# Patient Record
Sex: Male | Born: 1965 | Race: White | Hispanic: No | Smoking: Former smoker
Health system: Southern US, Community
[De-identification: ages and names within clinical notes are randomized; demographics above are authoritative.]

## PROBLEM LIST (undated history)

## (undated) DIAGNOSIS — G8929 Other chronic pain: Secondary | ICD-10-CM

## (undated) DIAGNOSIS — M549 Dorsalgia, unspecified: Secondary | ICD-10-CM

## (undated) DIAGNOSIS — G4730 Sleep apnea, unspecified: Secondary | ICD-10-CM

## (undated) DIAGNOSIS — E669 Obesity, unspecified: Secondary | ICD-10-CM

## (undated) DIAGNOSIS — Z789 Other specified health status: Secondary | ICD-10-CM

## (undated) HISTORY — PX: TONSILLECTOMY: SUR1361

## (undated) HISTORY — PX: OTHER SURGICAL HISTORY: SHX169

## (undated) HISTORY — PX: ADENOIDECTOMY: SUR15

---

## 2001-12-07 ENCOUNTER — Emergency Department (HOSPITAL_COMMUNITY): Admission: EM | Admit: 2001-12-07 | Discharge: 2001-12-07 | Payer: Self-pay | Admitting: Emergency Medicine

## 2001-12-07 ENCOUNTER — Encounter: Payer: Self-pay | Admitting: Emergency Medicine

## 2001-12-22 ENCOUNTER — Emergency Department (HOSPITAL_COMMUNITY): Admission: EM | Admit: 2001-12-22 | Discharge: 2001-12-22 | Payer: Self-pay

## 2010-05-31 ENCOUNTER — Encounter: Payer: Self-pay | Admitting: Pulmonary Disease

## 2010-06-06 ENCOUNTER — Encounter: Payer: Self-pay | Admitting: Pulmonary Disease

## 2010-07-26 DIAGNOSIS — E781 Pure hyperglyceridemia: Secondary | ICD-10-CM

## 2010-07-27 ENCOUNTER — Ambulatory Visit: Payer: Self-pay | Admitting: Pulmonary Disease

## 2010-07-27 DIAGNOSIS — G4710 Hypersomnia, unspecified: Secondary | ICD-10-CM | POA: Insufficient documentation

## 2010-07-27 DIAGNOSIS — G4730 Sleep apnea, unspecified: Secondary | ICD-10-CM

## 2010-08-04 ENCOUNTER — Ambulatory Visit: Payer: Self-pay | Admitting: Internal Medicine

## 2010-08-04 DIAGNOSIS — L259 Unspecified contact dermatitis, unspecified cause: Secondary | ICD-10-CM | POA: Insufficient documentation

## 2010-08-05 DIAGNOSIS — K219 Gastro-esophageal reflux disease without esophagitis: Secondary | ICD-10-CM | POA: Insufficient documentation

## 2010-08-21 ENCOUNTER — Encounter: Payer: Self-pay | Admitting: Internal Medicine

## 2010-08-21 ENCOUNTER — Encounter: Payer: Self-pay | Admitting: Pulmonary Disease

## 2010-08-21 ENCOUNTER — Ambulatory Visit (HOSPITAL_BASED_OUTPATIENT_CLINIC_OR_DEPARTMENT_OTHER)
Admission: RE | Admit: 2010-08-21 | Discharge: 2010-08-21 | Payer: Self-pay | Source: Home / Self Care | Admitting: Pulmonary Disease

## 2010-08-25 ENCOUNTER — Telehealth: Payer: Self-pay | Admitting: Pulmonary Disease

## 2010-08-28 ENCOUNTER — Encounter: Payer: Self-pay | Admitting: Pulmonary Disease

## 2010-09-02 ENCOUNTER — Ambulatory Visit: Payer: Self-pay | Admitting: Cardiology

## 2010-09-02 ENCOUNTER — Encounter: Payer: Self-pay | Admitting: Cardiology

## 2010-09-02 DIAGNOSIS — E669 Obesity, unspecified: Secondary | ICD-10-CM

## 2010-09-02 DIAGNOSIS — F17200 Nicotine dependence, unspecified, uncomplicated: Secondary | ICD-10-CM

## 2010-09-09 ENCOUNTER — Telehealth (INDEPENDENT_AMBULATORY_CARE_PROVIDER_SITE_OTHER): Payer: Self-pay | Admitting: *Deleted

## 2010-09-12 ENCOUNTER — Ambulatory Visit: Payer: Self-pay | Admitting: Pulmonary Disease

## 2010-09-15 ENCOUNTER — Encounter: Payer: Self-pay | Admitting: Pulmonary Disease

## 2010-10-05 ENCOUNTER — Ambulatory Visit: Admit: 2010-10-05 | Payer: Self-pay | Admitting: Internal Medicine

## 2010-10-20 ENCOUNTER — Telehealth: Payer: Self-pay | Admitting: Pulmonary Disease

## 2010-10-24 ENCOUNTER — Ambulatory Visit: Admit: 2010-10-24 | Payer: Self-pay | Admitting: Cardiology

## 2010-10-25 NOTE — Progress Notes (Signed)
  Phone Note Outgoing Call   Call placed by: Comer Locket. Vassie Loll MD,  August 25, 2010 1:24 PM Action Taken: Phone Call Completed Reason for Call: Discuss lab or test results Summary of Call: discused PSG results - start CPAP 11 cm large full face mask, humidity, download in 4 weeks  Initial call taken by: Comer Locket. Vassie Loll MD,  August 25, 2010 1:25 PM

## 2010-10-25 NOTE — Assessment & Plan Note (Signed)
Summary: sleep apnea/apc   Visit Type:  Initial Consult  CC:  Sleep consult.  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 He has gained 20-30 lbs over the last 2 years. Bed partner has noted loud snoring  - enough to sleep in a different room& witnesed apneas. He reportsd restless sleep & tossing & turning, no fixed bedtime or wake up times unless he has to be at court, Works shifts, non refreshing sleep, unplanned  naps on days off. sleeps on side , x 2 pillows Iced tea , coffee, sodas  3-4 . Epworth Sleepiness Score 20  There is no history suggestive of cataplexy, sleep paralysis or parasomnias  He also does not have a PCP & would like [place] to get im one.  Preventive Screening-Counseling & Management  Alcohol-Tobacco     Alcohol drinks/day: <1     Alcohol type: all     Smoking Status: current     Packs/Day: 1.5     Year Started: 1995   History of Present Illness: not sleeping restful, waking up during the night. Falling aslep when sitting up. Give out all the time  What time do you typically go to bed?(between what hours): unable to answer  How long does it take you to fall asleep? depends on sitting up or laying down  How many times during the night do you wake up? at least 3 to 4 times I remember  What time do you get out of bed to start your day? unable to answer  Do you drive or operate heavy machinery in your occupation? yes  How much has your weight changed (up or down) over the past two years? (in pounds): drop 20 and regain 20  Have you ever had a sleep study before?  If yes,when and where: no  Do you currently use CPAP ? If so , at what pressure? no  Do you wear oxygen at any time? If yes, how many liters per minute? no Current Medications (verified): 1)  Zantac 150 Mg Tabs (Ranitidine Hcl) .... As Needed 2)  Nexium 40 Mg Cpdr (Esomeprazole  Magnesium) .... As Needed Usually Once Weekly 3)  Bc Fast Pain Relief 650-195-33.3 Mg Pack (Aspirin-Salicylamide-Caffeine) .... As Needed  Allergies (verified): 1)  ! Pcn  Past History:  Past Surgical History: Last updated: 08-15-10 Tonsillectomy  Family History: Last updated: 08-15-2010 Heart dz- Father (deceased age 12 with MI)  Social History: Current smoker.  Smokes 1 ppd. Marital Status: single Children: yes Occupation: Chartered loss adjuster Smoking Status:  current Packs/Day:  1.5 Alcohol drinks/day:  <1  Review of Systems       The patient complains of shortness of breath with activity, productive cough, non-productive cough, irregular heartbeats, acid heartburn, indigestion, weight change, tooth/dental problems, headaches, sneezing, itching, anxiety, hand/feet swelling, joint stiffness or pain, rash, and change in color of mucus.  The patient denies shortness of breath at rest, coughing up blood, chest pain, loss of appetite, abdominal pain, difficulty swallowing, sore throat, nasal congestion/difficulty breathing through nose, ear ache, depression, and fever.    Vital Signs:  Patient profile:   45 year old male Height:      73 inches Weight:      313.2 pounds BMI:     41.47 O2 Sat:      97 % on Room air Temp:     97.7 degrees F  oral Pulse rate:   98 / minute BP sitting:   134 / 90  (left arm) Cuff size:   large  Vitals Entered By: Zackery Barefoot CMA (July 27, 2010 2:20 PM)  O2 Flow:  Room air CC: Sleep consult Comments Medications reviewed with patient Verified contact number and pharmacy with patient Zackery Barefoot CMA  July 27, 2010 2:22 PM    Physical Exam  Additional Exam:  wt 313 July 27, 2010  Gen. Pleasant, obese, in no distress, normal affect ENT - no lesions, no post nasal drip, class 3 airway Neck: No JVD, no thyromegaly, no carotid bruits Lungs: no use of accessory muscles, no dullness to percussion, clear without rales or rhonchi    Cardiovascular: Rhythm regular, heart sounds  normal, no murmurs or gallops, no peripheral edema Abdomen: soft and non-tender, no hepatosplenomegaly, BS normal. Musculoskeletal: No deformities, no cyanosis or clubbing Neuro:  alert, non focal     Impression & Recommendations:  Problem # 1:  HYPERSOMNIA, ASSOCIATED WITH SLEEP APNEA (ICD-780.53) Pre test prob is very high The pathophysiology of obstructive sleep apnea, it's cardiovascular consequences and modes of treatment including CPAP were discussed with the patient in great detail.  We will get him with one of our IM doctors  Orders: Consultation Level III ([address]109) Internal Medicine Referral (Internal) Sleep Disorder Referral (Sleep Disorder)  Medications Added to Medication List This Visit: 1)  Zantac 150 Mg Tabs (Ranitidine hcl) .... As needed 2)  Nexium 40 Mg Cpdr (Esomeprazole magnesium) .... As needed usually once weekly 3)  Bc Fast Pain Relief 650-195-33.3 Mg Pack (Aspirin-salicylamide-caffeine) .... As needed  Patient Instructions: 1)  Copy sent to: Dr Ronal Fear  fax [phone] 2)  Please schedule a follow-up appointment in 2 weeks after sleep study 3)  Internal Medicine consultation   Not Administered:    Influenza Vaccine not given due to: declined

## 2010-10-25 NOTE — Assessment & Plan Note (Signed)
Summary: NEW/ BCBS /[place]   Vital Signs:  Patient profile:   45 year old male Height:      73 inches Weight:      313 pounds BMI:     41.44 O2 Sat:      95 % on Room air Temp:     98.4 degrees F oral Pulse rate:   85 / minute Pulse rhythm:   regular Resp:     16 per minute BP sitting:   132 / 82  (left arm) Cuff size:   large  Vitals Entered By: Rock Nephew CMA (August 04, 2010 1:57 PM)  Nutrition Counseling: Patient's BMI is greater than 25 and therefore counseled on weight management options.  O2 Flow:  Room air CC: Patient here to establish a PCP, Rash Is Patient Diabetic? No Pain Assessment Patient in pain? no       Does patient need assistance? Functional Status Self care Ambulation Normal   Primary Care Provider:  Etta Grandchild MD  CC:  Patient here to establish a PCP and Rash.  History of Present Illness:  Rash      This is a 45 year old man who presents with Rash.  The symptoms began >1 year ago.  The severity is described as moderate.  The patient reports itching and scaling, but denies macules, papules, nodules, hives, welts, pustules, blisters, ulcers, weeping, oozing, redness, increased warmth, and tenderness.  The rash is located on the chest, back, abdomen, right arm, left arm, right leg, left leg, and face/trunk/limbs diffusely.  The rash is worse with heat and worse with scratching.  The patient denies the following symptoms: fever, headache, facial swelling, tongue swelling, burning, difficulty breathing, abdominal pain, nausea, vomiting, diarrhea, dizziness, sore throat, dysuria, eye symptoms, and arthralgias.  The patient denies history of recent tick bite, recent tick exposure, other insect bite, recent infection, recent antibiotic use, new medication, new clothing, new topical exposure, recent travel, pet/animal contact, thyroid disease, chronic liver disease, autoimmune disease, chronic edema, and prior STD.    Preventive Screening-Counseling &  Management  Alcohol-Tobacco     Alcohol drinks/day: <1     Alcohol type: beer     >5/day in last 3 mos: no     Alcohol Counseling: not indicated; use of alcohol is not excessive or problematic     Feels need to cut down: no     Feels annoyed by complaints: no     Feels guilty re: drinking: no     Needs 'eye opener' in am: no     Smoking Status: never     Tobacco Counseling: not indicated; no tobacco use  Current Medications (verified): 1)  Zantac 150 Mg Tabs (Ranitidine Hcl) .... As Needed 2)  Nexium 40 Mg Cpdr (Esomeprazole Magnesium) .... As Needed Usually Once Weekly 3)  Bc Fast Pain Relief 650-195-33.3 Mg Pack (Aspirin-Salicylamide-Caffeine) .... As Needed  Allergies (verified): 1)  ! Pcn  Past History:  Past Medical History: Cardiac cath normal about 7-8 years ago by Dr. Laney Pastor in [place] - pt's report ( no records today ) Normal ETT by Dr. Ronal Fear in Sept. 2011 - pt's report Normal complete physical by Dr. Ronal Fear in Sept. 2011 - pt's report OSA GERD  Social History: Smoking Status:  never  Review of Systems       The patient complains of weight gain.  The patient denies anorexia, fever, weight loss, chest pain, syncope, dyspnea on exertion, peripheral edema, prolonged cough, headaches, hemoptysis, abdominal  pain, melena, hematochezia, severe indigestion/heartburn, muscle weakness, depression, abnormal bleeding, enlarged lymph nodes, and angioedema.   Derm:  Complains of dryness, itching, and rash; denies changes in color of skin, changes in nail beds, excessive perspiration, flushing, hair loss, insect bite(s), lesion(s), and poor wound healing.  Physical Exam  General:  alert, well-developed, well-nourished, well-hydrated, appropriate dress, normal appearance, healthy-appearing, cooperative to examination, good hygiene, and overweight-appearing.   Head:  normocephalic, atraumatic, no abnormalities observed, no abnormalities palpated, and no alopecia.   Eyes:  vision  grossly intact, pupils equal, pupils round, and pupils reactive to light.   Ears:  R ear normal and L ear normal.   Nose:  External nasal examination shows no deformity or inflammation. Nasal mucosa are pink and moist without lesions or exudates. Mouth:  Oral mucosa and oropharynx without lesions or exudates.  Teeth in good repair. Neck:  supple, full ROM, no masses, no thyromegaly, no JVD, normal carotid upstroke, and no carotid bruits.   Lungs:  normal respiratory effort, no intercostal retractions, no accessory muscle use, normal breath sounds, no dullness, no fremitus, no crackles, and no wheezes.   Heart:  normal rate, regular rhythm, no murmur, no gallop, no rub, and no JVD.   Abdomen:  soft, non-tender, normal bowel sounds, no distention, no masses, no guarding, no rigidity, no rebound tenderness, no abdominal hernia, no inguinal hernia, no hepatomegaly, and no splenomegaly.   Msk:  normal ROM, no joint tenderness, no joint swelling, no joint warmth, no redness over joints, no joint deformities, no joint instability, and no crepitation.   Pulses:  R and L carotid,radial,femoral,dorsalis pedis and posterior tibial pulses are full and equal bilaterally Extremities:  No clubbing, cyanosis, edema, or deformity noted with normal full range of motion of all joints.   Neurologic:  No cranial nerve deficits noted. Station and gait are normal. Plantar reflexes are down-going bilaterally. DTRs are symmetrical throughout. Sensory, motor and coordinative functions appear intact. Skin:  he has diffuse xerosis, scaling, dermagraphism, and lichenification with a rare "hot patch" of eczema on his torso. there are no discreet lesions such as targets, vesicles, excoariations, pustules, erythema, streaking and there are no lesions on the palms and soles. Cervical Nodes:  no anterior cervical adenopathy and no posterior cervical adenopathy.   Axillary Nodes:  no R axillary adenopathy and no L axillary adenopathy.     Inguinal Nodes:  no R inguinal adenopathy and no L inguinal adenopathy.   Psych:  Cognition and judgment appear intact. Alert and cooperative with normal attention span and concentration. No apparent delusions, illusions, hallucinations   Impression & Recommendations:  Problem # 1:  ECZEMA (ICD-692.9) Assessment New  His updated medication list for this problem includes:    Allegra Allergy 180 Mg Tabs (Fexofenadine hcl) ..... One by mouth once daily as needed for itching  Orders: Admin of Therapeutic Inj  intramuscular or subcutaneous (95188) Depo- Medrol 40mg  (J1030)  Problem # 2:  CORONARY ARTERY DISEASE, FAMILY HX (ICD-V17.3) Assessment: Unchanged He wants to see if he needs another cardiac cath. since his family history is very significant. Orders: Cardiology Referral (Cardiology)  Problem # 3:  GERD (ICD-530.81) Assessment: Improved  His updated medication list for this problem includes:    Zantac 150 Mg Tabs (Ranitidine hcl) .Marland Kitchen... As needed    Nexium 40 Mg Cpdr (Esomeprazole magnesium) .Marland Kitchen... As needed usually once weekly  Complete Medication List: 1)  Zantac 150 Mg Tabs (Ranitidine hcl) .... As needed 2)  Nexium 40 Mg  Cpdr (Esomeprazole magnesium) .... As needed usually once weekly 3)  Bc Fast Pain Relief 650-195-33.3 Mg Pack (Aspirin-salicylamide-caffeine) .... As needed 4)  Allegra Allergy 180 Mg Tabs (Fexofenadine hcl) .... One by mouth once daily as needed for itching  Patient Instructions: 1)  Moisturize your skin once a day with an oil based product. 2)  Please schedule a follow-up appointment in 2 months. 3)  You need to lose weight. Consider a lower calorie diet and regular exercise.  Prescriptions: ALLEGRA ALLERGY 180 MG TABS (FEXOFENADINE HCL) One by mouth once daily as needed for itching  #30 x 11   Entered and Authorized by:   Etta Grandchild MD   Signed by:   Etta Grandchild MD on 08/04/2010   Method used:   Print then Give to Patient   RxID:    [id number]    Medication Administration  Injection # 1:    Medication: Depo- Medrol 40mg     Diagnosis: ECZEMA (ICD-692.9)    Route: IM    Site: R deltoid    Exp Date: 01/2013    Lot #: [id number]    Mfr: pfizer    Comments: pt received 120mg     Patient tolerated injection without complications    Given by: Rock Nephew CMA (August 04, 2010 2:25 PM)  Orders Added: 1)  Admin of Therapeutic Inj  intramuscular or subcutaneous [96372] 2)  Depo- Medrol 40mg  [J1030] 3)  Cardiology Referral [Cardiology] 4)  New Patient Level IV [95188]     Medication Administration  Injection # 1:    Medication: Depo- Medrol 40mg     Diagnosis: ECZEMA (ICD-692.9)    Route: IM    Site: R deltoid    Exp Date: 01/2013    Lot #: [id number]    Mfr: pfizer    Comments: pt received 120mg     Patient tolerated injection without complications    Given by: Rock Nephew CMA (August 04, 2010 2:25 PM)  Orders Added: 1)  Admin of Therapeutic Inj  intramuscular or subcutaneous [96372] 2)  Depo- Medrol 40mg  [J1030] 3)  Cardiology Referral [Cardiology] 4)  New Patient Level IV [41660]

## 2010-10-25 NOTE — Assessment & Plan Note (Signed)
Summary: np6/ CAD. PT HAS BCBS/ [place]   Visit Type:  Initial Consult Primary Provider:  Etta Grandchild MD  CC:  HTN.  History of Present Illness: The patient is a pleasant 45 year old with a strong family history of coronary disease. His father died suddenly of a month function and 61s. He had a first cousin died recently in his 65s of a myocardial infarction. His mother also had early-onset heart disease. He reported a cardiac catheterization 7/8 years ago and he was told this was normal. He did have an exercise treadmill test recently which was normal as well. However, he wanted to be evaluated for risk reduction and further testing if needed. 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 He does smoke. He does not have chest pressure, neck or arm discomfort. He does not have palpitations, presyncope or syncope. He does not have PND or orthopnea. He has no weight gain or edema. He was recently found to have sleep apnea and is having this treated.  Current Medications (verified): 1)  Zantac 150 Mg Tabs (Ranitidine Hcl) .... As Needed 2)  Bc Fast Pain Relief 650-195-33.3 Mg Pack (Aspirin-Salicylamide-Caffeine) .... As Needed  Allergies (verified): 1)  ! Pcn  Past History:  Past Medical History: Reviewed history from 08/04/2010 and no changes required. Cardiac cath normal about 7-8 years ago by Dr. Laney Pastor in [place] - pt's report ( no records today ) Normal ETT by Dr. Ronal Fear in Sept. 2011 - pt's report Normal complete physical by Dr. Ronal Fear in Sept. 2011 - pt's report OSA GERD  Past Surgical History: Reviewed history from 07/26/2010 and no changes required. Tonsillectomy  Review of Systems       As stated in the HPI and negative for all other systems.   Vital Signs:  Patient profile:   45 year old male Height:      73 inches Weight:      310 pounds BMI:     41.05 Pulse rate:   80 / minute Resp:     16 per minute BP sitting:   123 / 90  (right  arm)  Vitals Entered By: Marrion Coy, CNA (September 02, 2010 12:01 PM)  Physical Exam  General:  Well developed, well nourished, in no acute distress. Head:  normocephalic and atraumatic Eyes:  PERRLA/EOM intact; conjunctiva and lids normal. Mouth:  Teeth, gums and palate normal. Oral mucosa normal. Neck:  Neck supple, no JVD. No masses, thyromegaly or abnormal cervical nodes. Chest Wall:  no deformities or breast masses noted Lungs:  Clear bilaterally to auscultation and percussion. Abdomen:  Bowel sounds positive; abdomen soft and non-tender without masses, organomegaly, or hernias noted. No hepatosplenomegaly. Msk:  Back normal, normal gait. Muscle strength and tone normal. Extremities:  No clubbing or cyanosis. Neurologic:  Alert and oriented x 3. Skin:  Intact without lesions or rashes. Cervical Nodes:  no significant adenopathy Axillary Nodes:  no significant adenopathy Inguinal Nodes:  no significant adenopathy Psych:  Normal affect.   Detailed Cardiovascular Exam  Neck    Carotids: Carotids full and equal bilaterally without bruits.      Neck Veins: Normal, no JVD.    Heart    Inspection: no deformities or lifts noted.      Palpation: normal PMI with no thrills palpable.      Auscultation: regular rate and rhythm, S1, S2 without murmurs, rubs, gallops, or clicks.    Vascular    Abdominal Aorta: no palpable masses,  pulsations, or audible bruits.      Femoral Pulses: normal femoral pulses bilaterally.      Pedal Pulses: R and L carotid,radial,femoral,dorsalis pedis and posterior tibial pulses are full and equal bilaterallynormal pedal pulses bilaterally.      Radial Pulses: normal radial pulses bilaterally.      Peripheral Circulation: no clubbing, cyanosis, or edema noted with normal capillary refill.     EKG  Procedure date:  09/02/2010  Findings:      normal sinus rhythm, rate 80, axis within normal limits, early transition in lead V2, no acute ST-T wave  changes  Impression & Recommendations:  Problem # 1:  HYPERTRIGLYCERIDEMIA (ICD-272.1) I have advised given his very strong family history the patient should have an LDL in the 70s her lower and HDL in the 40s or 50s. We have discussed at length diet and exercise. I also would suggest Crestor though he knows this is outside guidelines.  Problem # 2:  CORONARY ARTERY DISEASE, FAMILY HX (ICD-V17.3) No further testing is indicated at this point though we need aggressive risk reduction.  Problem # 3:  TOBACCO ABUSE (ICD-305.1) We discussed the need to stop smoking and specific plans (greater than 3 minutes).  Problem # 4:  OBESITY, UNSPECIFIED (ICD-278.00) We discussed eventual need for weight loss.  Patient Instructions: 1)  Your physician recommends that you schedule a follow-up appointment in: 6 months with Dr Antoine Poche 2)  Your physician recommends that you return for a FASTING lipid and liver profile: in 8 weeks 3)  Your physician has recommended you make the following change in your medication: start Crestor 10 mg once a day 4)  Your physician discussed the hazards of tobacco use.  Tobacco use cessation is recommended and techniques and options to help you quit were discussed. Prescriptions: CRESTOR 10 MG TABS (ROSUVASTATIN CALCIUM) one daily  #30 x 11   Entered by:   Charolotte Capuchin, RN   Authorized by:   Rollene Rotunda, MD, [facility]   Signed by:   Charolotte Capuchin, RN on 09/02/2010   Method used:   Electronically to        [facility]  [address] [phone]* (retail)       [address] [place], [place]  [address]       Ph: [phone] or [phone]       Fax: [phone]   RxID:   [id number]  I have reviewed and approved all prescriptions at the time of this visit. Rollene Rotunda, MD, [facility]  September 02, 2010 1:53 PM

## 2010-10-25 NOTE — Letter (Signed)
Summary: [facility]   Imported By: Lester [place] 08/04/2010 07:34:57  _____________________________________________________________________  External Attachment:    Type:   Image     Comment:   External Document

## 2010-10-27 NOTE — Medication Information (Signed)
Summary: CPAP Order / [facility]  CPAP Order / [facility]   Imported By: Lennie Odor 09/07/2010 14:12:29  _____________________________________________________________________  External Attachment:    Type:   Image     Comment:   External Document

## 2010-10-27 NOTE — Letter (Signed)
Summary: CMN for CPAP/[facility]  CMN for CPAP/[facility]   Imported By: Sherian Rein 09/28/2010 11:22:46  _____________________________________________________________________  External Attachment:    Type:   Image     Comment:   External Document

## 2010-10-27 NOTE — Progress Notes (Signed)
Summary: speak to nurse - ATC x 3 line busy - LMTCB x 2  Phone Note Other Incoming Call back at [phone]   Caller: leandra//[facility] Summary of Call: States that nurse called for report on pt, he is not due for a report yet and wants to speak to nurse in ref to this. Initial call taken by: Darletta Moll,  September 09, 2010 3:53 PM  Follow-up for Phone Call        ATC # provided above x 3 -- line busy.  WCB Crystal Jones RN  September 09, 2010 4:25 PM  I spoke with Juanna Cao at DME and she says the pt is not due for a compliance report for 2 more weeks. He is currently using a different mask but is not satisfied and she has ordered a new mask that has not come in yet so she doesn't know how compliant he has been. Does pt still need OV today without report? Pls advise.  Follow-up by: Michel Bickers CMA,  September 12, 2010 9:13 AM  Additional Follow-up for Phone Call Additional follow up Details #1::        Did not have a chance to review this phone note before scheduled appointment time today. Will forward to RA for review. Zackery Barefoot CMA  September 12, 2010 4:35 PM t  pl arrange OV regardles of hwether download available Additional Follow-up by: Comer Locket. Vassie Loll MD,  September 12, 2010 5:03 PM    Additional Follow-up for Phone Call Additional follow up Details #2::    Pt already has f/u scheduled for 10/19/10 [facility] to see if pt wants to schedule appt sooner. Gweneth Dimitri RN  September 13, 2010 1:40 PM\par  [facility]  Gweneth Dimitri RN  September 14, 2010 5:33 PM  LMOMTCB Vernie Murders  September 15, 2010 3:27 PM Will sign msg per protocol Vernie Murders  September 15, 2010 3:28 PM

## 2010-10-27 NOTE — Progress Notes (Signed)
Summary: nos appt  Phone Note Call from Patient   Caller: [email or web address]  Call For: Vedanshi Massaro Summary of Call: In ref to nos from 1/25 pt states he will call next week to rsc. Initial call taken by: Darletta Moll,  October 20, 2010 9:32 AM

## 2011-02-23 ENCOUNTER — Emergency Department (HOSPITAL_BASED_OUTPATIENT_CLINIC_OR_DEPARTMENT_OTHER)
Admission: EM | Admit: 2011-02-23 | Discharge: 2011-02-23 | Disposition: A | Discharge status: Home / Self Care | Payer: BC Managed Care – PPO | Attending: Emergency Medicine | Admitting: Emergency Medicine

## 2011-02-23 DIAGNOSIS — M545 Low back pain, unspecified: Secondary | ICD-10-CM | POA: Insufficient documentation

## 2012-06-19 ENCOUNTER — Encounter (HOSPITAL_BASED_OUTPATIENT_CLINIC_OR_DEPARTMENT_OTHER): Payer: Self-pay | Admitting: *Deleted

## 2012-06-19 NOTE — Progress Notes (Signed)
Not on any med Does have sleep apnea and uses a cpap-to bring  Study 2011 in epic

## 2012-06-20 ENCOUNTER — Ambulatory Visit (HOSPITAL_BASED_OUTPATIENT_CLINIC_OR_DEPARTMENT_OTHER): Payer: Worker's Compensation | Admitting: Certified Registered Nurse Anesthetist

## 2012-06-20 ENCOUNTER — Encounter (HOSPITAL_BASED_OUTPATIENT_CLINIC_OR_DEPARTMENT_OTHER): Payer: Self-pay

## 2012-06-20 ENCOUNTER — Encounter (HOSPITAL_BASED_OUTPATIENT_CLINIC_OR_DEPARTMENT_OTHER)
Admission: RE | Disposition: A | Discharge status: Home / Self Care | Payer: Self-pay | Source: Ambulatory Visit | Attending: Orthopedic Surgery

## 2012-06-20 ENCOUNTER — Encounter (HOSPITAL_BASED_OUTPATIENT_CLINIC_OR_DEPARTMENT_OTHER): Payer: Self-pay | Admitting: Certified Registered Nurse Anesthetist

## 2012-06-20 ENCOUNTER — Ambulatory Visit (HOSPITAL_BASED_OUTPATIENT_CLINIC_OR_DEPARTMENT_OTHER)
Admission: RE | Admit: 2012-06-20 | Discharge: 2012-06-20 | Disposition: A | Discharge status: Home / Self Care | Payer: Worker's Compensation | Source: Ambulatory Visit | Attending: Orthopedic Surgery | Admitting: Orthopedic Surgery

## 2012-06-20 DIAGNOSIS — Y990 Civilian activity done for income or pay: Secondary | ICD-10-CM | POA: Insufficient documentation

## 2012-06-20 DIAGNOSIS — S62329A Displaced fracture of shaft of unspecified metacarpal bone, initial encounter for closed fracture: Secondary | ICD-10-CM | POA: Insufficient documentation

## 2012-06-20 DIAGNOSIS — X58XXXA Exposure to other specified factors, initial encounter: Secondary | ICD-10-CM | POA: Insufficient documentation

## 2012-06-20 HISTORY — DX: Obesity, unspecified: E66.9

## 2012-06-20 HISTORY — DX: Sleep apnea, unspecified: G47.30

## 2012-06-20 HISTORY — DX: Dorsalgia, unspecified: M54.9

## 2012-06-20 HISTORY — DX: Other specified health status: Z78.9

## 2012-06-20 HISTORY — DX: Other chronic pain: G89.29

## 2012-06-20 SURGERY — OPEN REDUCTION INTERNAL FIXATION (ORIF) METACARPAL
Anesthesia: General | Site: Hand | Laterality: Left

## 2012-06-20 MED ORDER — OXYCODONE-ACETAMINOPHEN 10-325 MG PO TABS: 1.0000 | ORAL_TABLET | ORAL | Status: DC | PRN

## 2012-06-20 MED ORDER — FENTANYL CITRATE 0.05 MG/ML IJ SOLN
INTRAMUSCULAR | Status: DC | PRN
  Administered 2012-06-20: 50 ug via INTRAVENOUS
  Administered 2012-06-20: 100 ug via INTRAVENOUS

## 2012-06-20 MED ORDER — BUPIVACAINE HCL (PF) 0.25 % IJ SOLN
INTRAMUSCULAR | Status: DC | PRN
  Administered 2012-06-20: 9 mL

## 2012-06-20 MED ORDER — MIDAZOLAM HCL 5 MG/5ML IJ SOLN
INTRAMUSCULAR | Status: DC | PRN
  Administered 2012-06-20: 2 mg via INTRAVENOUS

## 2012-06-20 MED ORDER — LACTATED RINGERS IV SOLN
INTRAVENOUS | Status: DC
  Administered 2012-06-20: 15:00:00 via INTRAVENOUS
  Administered 2012-06-20: 10 mL/h via INTRAVENOUS
  Administered 2012-06-20: 13:00:00 via INTRAVENOUS

## 2012-06-20 MED ORDER — HYDROMORPHONE HCL PF 1 MG/ML IJ SOLN
0.2500 mg | INTRAMUSCULAR | Status: DC | PRN
  Administered 2012-06-20 (×4): 0.5 mg via INTRAVENOUS

## 2012-06-20 MED ORDER — PROPOFOL 10 MG/ML IV BOLUS
INTRAVENOUS | Status: DC | PRN
  Administered 2012-06-20: 300 mg via INTRAVENOUS

## 2012-06-20 MED ORDER — OXYCODONE-ACETAMINOPHEN 5-325 MG PO TABS
1.0000 | ORAL_TABLET | ORAL | Status: DC | PRN
  Administered 2012-06-20: 1 via ORAL

## 2012-06-20 MED ORDER — LACTATED RINGERS IV SOLN: INTRAVENOUS | Status: DC | PRN

## 2012-06-20 MED ORDER — PROMETHAZINE HCL 25 MG/ML IJ SOLN: 6.2500 mg | INTRAMUSCULAR | Status: DC | PRN

## 2012-06-20 MED ORDER — ONDANSETRON HCL 4 MG/2ML IJ SOLN
INTRAMUSCULAR | Status: DC | PRN
  Administered 2012-06-20: 4 mg via INTRAVENOUS

## 2012-06-20 MED ORDER — DEXAMETHASONE SODIUM PHOSPHATE 10 MG/ML IJ SOLN
INTRAMUSCULAR | Status: DC | PRN
  Administered 2012-06-20: 10 mg via INTRAVENOUS

## 2012-06-20 MED ORDER — MIDAZOLAM HCL 2 MG/2ML IJ SOLN
1.0000 mg | INTRAMUSCULAR | Status: DC | PRN
  Administered 2012-06-20: 2 mg via INTRAVENOUS

## 2012-06-20 MED ORDER — MIDAZOLAM HCL 2 MG/2ML IJ SOLN: 0.5000 mg | Freq: Once | INTRAMUSCULAR | Status: DC | PRN

## 2012-06-20 MED ORDER — DOCUSATE SODIUM 100 MG PO CAPS: 100.0000 mg | ORAL_CAPSULE | Freq: Two times a day (BID) | ORAL | Status: DC

## 2012-06-20 MED ORDER — MEPERIDINE HCL 25 MG/ML IJ SOLN: 6.2500 mg | INTRAMUSCULAR | Status: DC | PRN

## 2012-06-20 MED ORDER — LIDOCAINE HCL (CARDIAC) 20 MG/ML IV SOLN
INTRAVENOUS | Status: DC | PRN
  Administered 2012-06-20: 20 mg via INTRAVENOUS

## 2012-06-20 SURGICAL SUPPLY — 76 items
BANDAGE CONFORM 2  STR LF (GAUZE/BANDAGES/DRESSINGS) IMPLANT
BANDAGE CONFORM 3  STR LF (GAUZE/BANDAGES/DRESSINGS) IMPLANT
BANDAGE ELASTIC 3 VELCRO ST LF (GAUZE/BANDAGES/DRESSINGS) ×2 IMPLANT
BANDAGE ELASTIC 4 VELCRO ST LF (GAUZE/BANDAGES/DRESSINGS) ×2 IMPLANT
BANDAGE GAUZE ELAST BULKY 4 IN (GAUZE/BANDAGES/DRESSINGS) ×2 IMPLANT
BIT DRILL 1.1 (BIT) ×1
BIT DRILL 60X20X1.1XQC TMX (BIT) ×1 IMPLANT
BIT DRL 60X20X1.1XQC TMX (BIT) ×1
BLADE SURG 15 STRL LF DISP TIS (BLADE) ×2 IMPLANT
BLADE SURG 15 STRL SS (BLADE) ×2
BNDG COHESIVE 1X5 TAN STRL LF (GAUZE/BANDAGES/DRESSINGS) IMPLANT
BNDG COHESIVE 3X5 TAN STRL LF (GAUZE/BANDAGES/DRESSINGS) IMPLANT
BNDG ELASTIC 2 VLCR STRL LF (GAUZE/BANDAGES/DRESSINGS) IMPLANT
BNDG ESMARK 4X9 LF (GAUZE/BANDAGES/DRESSINGS) ×2 IMPLANT
BRUSH SCRUB EZ PLAIN DRY (MISCELLANEOUS) IMPLANT
CANISTER SUCTION 1200CC (MISCELLANEOUS) IMPLANT
CORDS BIPOLAR (ELECTRODE) ×2 IMPLANT
COVER MAYO STAND STRL (DRAPES) ×2 IMPLANT
COVER TABLE BACK 60X90 (DRAPES) ×2 IMPLANT
CUFF TOURNIQUET SINGLE 18IN (TOURNIQUET CUFF) IMPLANT
CUFF TOURNIQUET SINGLE 24IN (TOURNIQUET CUFF) ×2 IMPLANT
DECANTER SPIKE VIAL GLASS SM (MISCELLANEOUS) IMPLANT
DRAIN TLS ROUND 10FR (DRAIN) IMPLANT
DRAPE EXTREMITY T 121X128X90 (DRAPE) ×2 IMPLANT
DRAPE OEC MINIVIEW 54X84 (DRAPES) ×2 IMPLANT
DRAPE SURG 17X23 STRL (DRAPES) ×2 IMPLANT
DRSG EMULSION OIL 3X3 NADH (GAUZE/BANDAGES/DRESSINGS) ×2 IMPLANT
GAUZE SPONGE 4X4 16PLY XRAY LF (GAUZE/BANDAGES/DRESSINGS) IMPLANT
GLOVE BIO SURGEON STRL SZ8 (GLOVE) ×2 IMPLANT
GLOVE ECLIPSE 6.5 STRL STRAW (GLOVE) ×2 IMPLANT
GOWN PREVENTION PLUS XLARGE (GOWN DISPOSABLE) ×2 IMPLANT
GOWN PREVENTION PLUS XXLARGE (GOWN DISPOSABLE) ×2 IMPLANT
K-WIRE DBL TRONS .035X6 (WIRE) ×2
KWIRE DBL TRONS .035X6 (WIRE) ×1 IMPLANT
LOCK SCREW 1.5X15MM (Screw) ×2 IMPLANT
NEEDLE HYPO 22GX1.5 SAFETY (NEEDLE) IMPLANT
NEEDLE HYPO 25X1 1.5 SAFETY (NEEDLE) ×2 IMPLANT
NS IRRIG 1000ML POUR BTL (IV SOLUTION) ×2 IMPLANT
PACK BASIN DAY SURGERY FS (CUSTOM PROCEDURE TRAY) ×2 IMPLANT
PAD ALCOHOL SWAB (MISCELLANEOUS) IMPLANT
PAD CAST 3X4 CTTN HI CHSV (CAST SUPPLIES) IMPLANT
PAD CAST 4YDX4 CTTN HI CHSV (CAST SUPPLIES) IMPLANT
PADDING CAST ABS 3INX4YD NS (CAST SUPPLIES) ×1
PADDING CAST ABS 4INX4YD NS (CAST SUPPLIES) ×1
PADDING CAST ABS COTTON 3X4 (CAST SUPPLIES) ×1 IMPLANT
PADDING CAST ABS COTTON 4X4 ST (CAST SUPPLIES) ×1 IMPLANT
PADDING CAST COTTON 2X4 NS (CAST SUPPLIES) IMPLANT
PADDING CAST COTTON 3X4 STRL (CAST SUPPLIES)
PADDING CAST COTTON 4X4 STRL (CAST SUPPLIES)
PLATE T CONT 1.5MM LOCKING (Plate) ×2 IMPLANT
SCREW 1.5X15MM (Screw) ×2 IMPLANT
SCREW LOCK 1.5X15MM (Screw) ×1 IMPLANT
SCREW LOCKING 1.5X16 (Screw) ×2 IMPLANT
SCREW NL 1.5X11 WRIST (Screw) ×4 IMPLANT
SCREW NL 1.5X12 (Screw) ×2 IMPLANT
SCREW NL 1.5X13 (Screw) ×2 IMPLANT
SCREW NONIOC 1.5 14M (Screw) ×2 IMPLANT
SHEET MEDIUM DRAPE 40X70 STRL (DRAPES) ×2 IMPLANT
SPLINT FIBERGLASS 3X35 (CAST SUPPLIES) IMPLANT
SPLINT FIBERGLASS 4X30 (CAST SUPPLIES) ×2 IMPLANT
SPLINT PLASTER CAST XFAST 3X15 (CAST SUPPLIES) IMPLANT
SPLINT PLASTER CAST XFAST 4X15 (CAST SUPPLIES) IMPLANT
SPLINT PLASTER XTRA FAST SET 4 (CAST SUPPLIES)
SPLINT PLASTER XTRA FASTSET 3X (CAST SUPPLIES)
STOCKINETTE 4X48 STRL (DRAPES) ×2 IMPLANT
STRIP CLOSURE SKIN 1/2X4 (GAUZE/BANDAGES/DRESSINGS) IMPLANT
SUCTION FRAZIER TIP 10 FR DISP (SUCTIONS) ×2 IMPLANT
SUT MON AB 4-0 PC3 18 (SUTURE) IMPLANT
SUT PROLENE 4 0 PS 2 18 (SUTURE) ×2 IMPLANT
SYR BULB 3OZ (MISCELLANEOUS) ×2 IMPLANT
SYR CONTROL 10ML LL (SYRINGE) ×2 IMPLANT
TOWEL OR 17X24 6PK STRL BLUE (TOWEL DISPOSABLE) ×4 IMPLANT
TRAY DSU PREP LF (CUSTOM PROCEDURE TRAY) ×2 IMPLANT
TUBE CONNECTING 20X1/4 (TUBING) ×2 IMPLANT
UNDERPAD 30X30 INCONTINENT (UNDERPADS AND DIAPERS) ×2 IMPLANT
WATER STERILE IRR 1000ML POUR (IV SOLUTION) ×2 IMPLANT

## 2012-06-20 NOTE — Anesthesia Procedure Notes (Signed)
Procedure Name: LMA Insertion Date/Time: 06/20/2012 3:02 PM Performed by: Burna Cash Pre-anesthesia Checklist: Patient identified, Emergency Drugs available, Suction available and Patient being monitored Patient Re-evaluated:Patient Re-evaluated prior to inductionOxygen Delivery Method: Circle System Utilized Preoxygenation: Pre-oxygenation with 100% oxygen Intubation Type: IV induction Ventilation: Mask ventilation without difficulty LMA: LMA inserted LMA Size: 5.0 Number of attempts: 1 Airway Equipment and Method: bite block Placement Confirmation: positive ETCO2 Tube secured with: Tape Dental Injury: Teeth and Oropharynx as per pre-operative assessment

## 2012-06-20 NOTE — Anesthesia Preprocedure Evaluation (Signed)
Anesthesia Evaluation  Patient identified by MRN, date of birth, ID band Patient awake    Reviewed: Allergy & Precautions, H&P , NPO status , Patient's Chart, lab work & pertinent test results  History of Anesthesia Complications Negative for: history of anesthetic complications  Airway Mallampati: II TM Distance: >3 FB Neck ROM: Full    Dental No notable dental hx. (+) Teeth Intact and Dental Advisory Given   Pulmonary sleep apnea and Continuous Positive Airway Pressure Ventilation ,  breath sounds clear to auscultation  Pulmonary exam normal       Cardiovascular negative cardio ROS  Rhythm:Regular Rate:Normal     Neuro/Psych negative neurological ROS  negative psych ROS   GI/Hepatic Neg liver ROS, GERD-  Controlled,  Endo/Other  Morbid obesity  Renal/GU negative Renal ROS     Musculoskeletal   Abdominal (+) + obese,   Peds  Hematology   Anesthesia Other Findings   Reproductive/Obstetrics                           Anesthesia Physical Anesthesia Plan  ASA: III  Anesthesia Plan: General   Post-op Pain Management:    Induction: Intravenous  Airway Management Planned: LMA  Additional Equipment:   Intra-op Plan:   Post-operative Plan:   Informed Consent: I have reviewed the patients History and Physical, chart, labs and discussed the procedure including the risks, benefits and alternatives for the proposed anesthesia with the patient or authorized representative who has indicated his/her understanding and acceptance.   Dental advisory given  Plan Discussed with: CRNA and Surgeon  Anesthesia Plan Comments: (Plan routine monitors, GA- LMA OK   Patient very nervous  )        Anesthesia Quick Evaluation

## 2012-06-20 NOTE — Anesthesia Postprocedure Evaluation (Signed)
  Anesthesia Post-op Note  Patient: Cameron Lynn  Procedure(s) Performed: Procedure(s) (LRB) with comments: OPEN REDUCTION INTERNAL FIXATION (ORIF) METACARPAL (Left) - LEFT SMALL FINGER ORIF   Patient Location: PACU  Anesthesia Type: General  Level of Consciousness: awake, alert , oriented and patient cooperative  Airway and Oxygen Therapy: Patient Spontanous Breathing  Post-op Pain: mild  Post-op Assessment: Post-op Vital signs reviewed, Patient's Cardiovascular Status Stable, Respiratory Function Stable, Patent Airway, No signs of Nausea or vomiting and Pain level controlled  Post-op Vital Signs: Reviewed and stable  Complications: No apparent anesthesia complications

## 2012-06-20 NOTE — Brief Op Note (Signed)
06/20/2012  2:50 PM  PATIENT:  Cameron Lynn  46 y.o. male  PRE-OPERATIVE DIAGNOSIS:  LEFT SMALL FINGER METACARPAL SHAFT FRACTURE  POST-OPERATIVE DIAGNOSIS:  Left small Finger Metacarpal Fracture  PROCEDURE:  Procedure(s) (LRB) with comments: OPEN REDUCTION INTERNAL FIXATION (ORIF) METACARPAL (Left) - LEFT SMALL FINGER ORIF   SURGEON:  Surgeon(s) and Role:    * Sharma Covert, MD - Primary  PHYSICIAN ASSISTANT: none  ASSISTANTS: none   ANESTHESIA:   general  EBL:   minimal  BLOOD ADMINISTERED:none  DRAINS: none   LOCAL MEDICATIONS USED:  MARCAINE     SPECIMEN:  No Specimen  DISPOSITION OF SPECIMEN:  N/A  COUNTS:  YES  TOURNIQUET:  * Missing tourniquet times found for documented tourniquets in log:  [id number] *  DICTATION: .696295  PLAN OF CARE: Discharge to home after PACU  PATIENT DISPOSITION:  PACU - hemodynamically stable.   Delay start of Pharmacological VTE agent (>24hrs) due to surgical blood loss or risk of bleeding: not applicable

## 2012-06-20 NOTE — Transfer of Care (Signed)
Immediate Anesthesia Transfer of Care Note  Patient: Cameron Lynn  Procedure(s) Performed: Procedure(s) (LRB) with comments: OPEN REDUCTION INTERNAL FIXATION (ORIF) METACARPAL (Left) - LEFT SMALL FINGER ORIF   Patient Location: PACU  Anesthesia Type: General  Level of Consciousness: sedated  Airway & Oxygen Therapy: Patient Spontanous Breathing and Patient connected to face mask oxygen  Post-op Assessment: Report given to PACU RN and Post -op Vital signs reviewed and stable  Post vital signs: Reviewed and stable  Complications: No apparent anesthesia complications

## 2012-06-20 NOTE — H&P (Signed)
Cameron Lynn is an 46 y.o. male.   Chief Complaint: left small finger metacarpal fracture HPI: pt sustained work related injury Pt with injury to left small finger Pt here for surgery  Past Medical History  Diagnosis Date  . No pertinent past medical history   . Chronic back pain   . Obese   . Sleep apnea     severe-uses a cpap    Past Surgical History  Procedure Date  . Tonsillectomy     History reviewed. No pertinent family history. Social History:  reports that he has been smoking.  He does not have any smokeless tobacco history on file. He reports that he drinks alcohol. He reports that he does not use illicit drugs.  Allergies:  Allergies  Allergen Reactions  . Penicillins Shortness Of Breath    REACTION: swelling    Medications Prior to Admission  Medication Sig Dispense Refill  . oxyCODONE-acetaminophen (PERCOCET/ROXICET) 5-325 MG per tablet Take 1 tablet by mouth every 4 (four) hours as needed.        No results found for this or any previous visit (from the past 48 hour(s)). No results found.  No recent illnesses/hospitalizations  Blood pressure 160/86, pulse 83, temperature 98.2 F (36.8 C), temperature source Oral, resp. rate 16, height 6\' 1"  (1.854 m), weight 142.883 kg (315 lb), SpO2 98.00%. General Appearance:  Alert, cooperative, no distress, appears stated age  Head:  Normocephalic, without obvious abnormality, atraumatic  Eyes:  Pupils equal, conjunctiva/corneas clear,         Throat: Lips, mucosa, and tongue normal; teeth and gums normal  Neck: No visible masses     Lungs:   respirations unlabored  Chest Wall:  No tenderness or deformity  Heart:  Regular rate and rhythm,  Abdomen:   Soft, non-tender,         Extremities: Left hand: skin intact fingers warm well perfused Good muscle tone in hand Loss of small finger knuckle contour Mild malrotation to finger   Pulses: 2+ and symmetric  Skin: Skin color, texture, turgor normal, no rashes  or lesions     Neurologic: Normal    Assessment/Plan Left small finger metacarpal shaft fracture displaced  Left small finger metacarpal open reduction and internal fixation  R/B/A DISCUSSED WITH PT IN OFFICE.  PT VOICED UNDERSTANDING OF PLAN CONSENT SIGNED DAY OF SURGERY PT SEEN AND EXAMINED PRIOR TO OPERATIVE PROCEDURE/DAY OF SURGERY SITE MARKED. QUESTIONS ANSWERED WILL GO HOME FOLLOWING SURGERY  Sharma Covert 06/20/2012, 2:45 PM

## 2012-06-21 NOTE — Op Note (Signed)
NAME:  Cameron Lynn, Cameron Lynn NO.:  [id number]  MEDICAL RECORD NO.:  [id number]  LOCATION:                                 FACILITY:  PHYSICIAN:  Madelynn Done, MD  DATE OF BIRTH:  Dec 25, 1965  DATE OF PROCEDURE:  03/20/2012 DATE OF DISCHARGE:                              OPERATIVE REPORT   PREOPERATIVE DIAGNOSIS:  Left small finger metacarpal shaft fracture.  POSTOPERATIVE DIAGNOSIS:  Left small finger metacarpal shaft fracture.  ATTENDING PHYSICIAN:  Sharma Covert IV, MD, who scrubbed and present for the entire procedure.  ASSISTANT SURGEON:  None.  SURGICAL PROCEDURE: 1. Open treatment of left small finger metacarpal shaft fracture     requiring internal fixation 2. Radiograph 3 views, left hand.  SURGICAL INDICATIONS:  Cameron Lynn is a 45 year old right-hand-dominant gentleman, who sustained a close injury to his left small finger metacarpal shaft.  The patient was given the displacement and angulation.  It is recommended that the patient undergo the above procedure.  Risks, benefits, and alternatives were discussed in detail with the patient and a signed informed consent was obtained.  Risks include, but not limited to bleeding, infection, damage to nearby nerves, arteries, or tendons, nonunion, malunion, hardware failure, loss of motion of the wrist and digits, and need for further surgical intervention.  DESCRIPTION OF PROCEDURE:  The patient was properly identified in the preop holding area and a mark with a permanent marker made on the left small finger to indicate correct operative site.  The patient was then brought back to the operating room and placed supine on the anesthesia room table.  General anesthesia was administered.  The patient tolerated this well.  The patient received preoperative antibiotics prior to skin incision.  A well-padded tourniquet was then placed on the left brachium and sealed with 1000 drape.  Left upper extremity was  then prepped and draped in normal sterile fashion.  Time-out was called.  Correct site was identified and procedure was then begun.  Attention was then turned to left small finger.  A longitudinal incision made directly over the small finger metacarpal shaft.  Dissection was then carried down through the skin and subcutaneous tissue.  Extensor tendons were then identified and carefully retracted.  The fascial layer was incised directly over the bone and then carefully elevated for nice fascial flap.  Following this, the fracture hematoma was then evacuated.  After evacuation of the fracture hematoma, the fracture site was then reduced and held in place with a reduction clamp.  Using DePuy hand ALPS system, 1.5 mm T-plate was then cut and fashioned to the dorsal surface of the bone.  This was held in place with K-wires and position confirmed using mini C-arm. After adequate plate position, attention was then turned to fixation. Combination of locking and nonlocking 1.5 mm screws were then used, 6 cortices distally and 8 cortices proximally.  There were 3 nonlocking screws distally, 2 nonlocking screws proximally, and 2 locking screws. The reduction clamp was then removed.  There was good stability and good fixation.  The wound was then thoroughly irrigated.  Final radiographs were then obtained which did show good  anatomical reduction.  Copious wound irrigation done throughout.  Final radiographs were then printed. The fascial layer was then closed with 3-0 Vicryl, subcutaneous tissues closed with 4-0 Vicryl, and skin closed with simple 4-0 Prolene sutures. Adaptic dressing, sterile compressive bandage then applied.  The patient tolerated the procedure well and returned to recovery room in good condition.  RADIOGRAPHS:  Three views of the hand do show the internal fixation in place.  There is good position in both planes.  POSTOPERATIVE PLAN:  The patient will be discharged home and seen  back in the office in approximately 12 days for wound check, suture removal, and begin a postoperative ORIF metacarpal protocol.  Radiographs at each visit.     Madelynn Done, MD     FWO/MEDQ  D:  06/20/2012  T:  06/21/2012  Job:  [id number]

## 2013-02-20 ENCOUNTER — Emergency Department (HOSPITAL_BASED_OUTPATIENT_CLINIC_OR_DEPARTMENT_OTHER)
Admission: EM | Admit: 2013-02-20 | Discharge: 2013-02-21 | Disposition: A | Discharge status: Home / Self Care | Payer: BC Managed Care – PPO | Attending: Emergency Medicine | Admitting: Emergency Medicine

## 2013-02-20 ENCOUNTER — Encounter (HOSPITAL_BASED_OUTPATIENT_CLINIC_OR_DEPARTMENT_OTHER): Payer: Self-pay | Admitting: *Deleted

## 2013-02-20 DIAGNOSIS — G8929 Other chronic pain: Secondary | ICD-10-CM | POA: Insufficient documentation

## 2013-02-20 DIAGNOSIS — Z862 Personal history of diseases of the blood and blood-forming organs and certain disorders involving the immune mechanism: Secondary | ICD-10-CM | POA: Insufficient documentation

## 2013-02-20 DIAGNOSIS — Y939 Activity, unspecified: Secondary | ICD-10-CM | POA: Insufficient documentation

## 2013-02-20 DIAGNOSIS — F17200 Nicotine dependence, unspecified, uncomplicated: Secondary | ICD-10-CM | POA: Insufficient documentation

## 2013-02-20 DIAGNOSIS — H6690 Otitis media, unspecified, unspecified ear: Secondary | ICD-10-CM | POA: Insufficient documentation

## 2013-02-20 DIAGNOSIS — Z79899 Other long term (current) drug therapy: Secondary | ICD-10-CM | POA: Insufficient documentation

## 2013-02-20 DIAGNOSIS — Z880 Allergy status to penicillin: Secondary | ICD-10-CM | POA: Insufficient documentation

## 2013-02-20 DIAGNOSIS — S0920XA Traumatic rupture of unspecified ear drum, initial encounter: Secondary | ICD-10-CM | POA: Insufficient documentation

## 2013-02-20 DIAGNOSIS — H6692 Otitis media, unspecified, left ear: Secondary | ICD-10-CM

## 2013-02-20 DIAGNOSIS — Z8639 Personal history of other endocrine, nutritional and metabolic disease: Secondary | ICD-10-CM | POA: Insufficient documentation

## 2013-02-20 DIAGNOSIS — S0922XA Traumatic rupture of left ear drum, initial encounter: Secondary | ICD-10-CM

## 2013-02-20 DIAGNOSIS — X58XXXA Exposure to other specified factors, initial encounter: Secondary | ICD-10-CM | POA: Insufficient documentation

## 2013-02-20 DIAGNOSIS — Y929 Unspecified place or not applicable: Secondary | ICD-10-CM | POA: Insufficient documentation

## 2013-02-20 MED ORDER — ANTIPYRINE-BENZOCAINE 5.4-1.4 % OT SOLN
OTIC | Status: AC
  Filled 2013-02-20: qty 10

## 2013-02-20 MED ORDER — ANTIPYRINE-BENZOCAINE 5.4-1.4 % OT SOLN: 3.0000 [drp] | OTIC | Status: DC | PRN

## 2013-02-20 NOTE — ED Notes (Signed)
Pt c/o left ear ache that began this am. Pt has had the left tympanic membrane rupture several times as a child. Pt was seen at an UC earlier today and placed on levaquin.

## 2013-02-21 ENCOUNTER — Encounter (HOSPITAL_BASED_OUTPATIENT_CLINIC_OR_DEPARTMENT_OTHER): Payer: Self-pay | Admitting: Emergency Medicine

## 2013-02-21 MED ORDER — HYDROCODONE-ACETAMINOPHEN 5-325 MG PO TABS
ORAL_TABLET | ORAL | Status: AC
  Filled 2013-02-21: qty 2

## 2013-02-21 MED ORDER — HYDROCODONE-ACETAMINOPHEN 5-325 MG PO TABS
2.0000 | ORAL_TABLET | Freq: Once | ORAL | Status: AC
  Administered 2013-02-21: 2 via ORAL

## 2013-02-21 MED ORDER — OXYCODONE-ACETAMINOPHEN 5-325 MG PO TABS: 1.0000 | ORAL_TABLET | ORAL | Status: DC | PRN

## 2013-02-21 NOTE — ED Provider Notes (Signed)
History     CSN: [id number]  Arrival date & time 02/20/13  2313   First MD Initiated Contact with Patient 02/21/13 0107      Chief Complaint  Patient presents with  . Earache     (Consider location/radiation/quality/duration/timing/severity/associated sxs/prior treatment) HPI This is a 47 year old male with a history of tympanic membrane rupture as a child. He is here with an ear ache that began yesterday morning. He was seen in urgent care yesterday and placed on Levaquin without relief. He is here for worsening pain in the left ear that is radiating to the left side of his face. The pain is moderate to severe. It is sharp in nature. He has had no disequilibrium or nausea but has had decreased hearing acuity on the left side.  Past Medical History  Diagnosis Date  . No pertinent past medical history   . Chronic back pain   . Obese   . Sleep apnea     severe-uses a cpap    Past Surgical History  Procedure Laterality Date  . Tonsillectomy      No family history on file.  History  Substance Use Topics  . Smoking status: Current Every Day Smoker -- 1.00 packs/day  . Smokeless tobacco: Not on file  . Alcohol Use: Yes     Comment: occ      Review of Systems  All other systems reviewed and are negative.    Allergies  Penicillins  Home Medications   Current Outpatient Rx  Name  Route  Sig  Dispense  Refill  . levofloxacin (LEVAQUIN) 500 MG tablet   Oral   Take 500 mg by mouth daily.         Marland Kitchen docusate sodium (COLACE) 100 MG capsule   Oral   Take 1 capsule (100 mg total) by mouth 2 (two) times daily.   30 capsule   0   . oxyCODONE-acetaminophen (PERCOCET) 10-325 MG per tablet   Oral   Take 1 tablet by mouth every 4 (four) hours as needed for pain.   40 tablet   0   . oxyCODONE-acetaminophen (PERCOCET/ROXICET) 5-325 MG per tablet   Oral   Take 1 tablet by mouth every 4 (four) hours as needed.           BP 175/113  Pulse 83  Temp(Src) 98.6 F  (37 C) (Oral)  Resp 20  Ht 6\' 1"  (1.854 m)  Wt 325 lb (147.419 kg)  BMI 42.89 kg/m2  SpO2 98%  Physical Exam General: Well-developed, well-nourished male in no acute distress; appearance consistent with age of record HENT: normocephalic, atraumatic; right TM with old healed scarring; left TM bulging with fluid behind it Eyes: pupils equal round and reactive to light; extraocular muscles intact; decreased hearing acuity left ear Neck: supple Heart: regular rate and rhythm Lungs: Normal respiratory effort and excursion Abdomen: soft; nondistended Extremities: No deformity; full range of motion Neurologic: Awake, alert and oriented; motor function intact in all extremities and symmetric; no facial droop Skin: Warm and dry Psychiatric: Normal mood and affect    ED Course  Procedures (including critical care time)     MDM  1:21 AM Patient's left tympanic membrane now appears erythematous with apparent rupture; clear fluid draining from left ear. Patient advised to continue Levaquin. Will refer to ENT.        Hanley Seamen, MD 02/21/13 [phone]

## 2014-01-13 ENCOUNTER — Telehealth: Payer: Self-pay | Admitting: Pulmonary Disease

## 2014-01-13 DIAGNOSIS — G4733 Obstructive sleep apnea (adult) (pediatric): Secondary | ICD-10-CM

## 2014-01-13 NOTE — Telephone Encounter (Signed)
I called spoke with pt. He has not been seen since 07/27/10. I advised him he is suppose to be seen at least once a year and he has not been seen since 2011-so now considered as a new pt. He scheduled appt to see RA 02/02/14 for sleep consult. Advised him I have to get RA's okay to send an order to replace his machine since it has been since 2011 seeing him. He is aware RA not back in the office until 01/23/14. Please advise Dr. Vassie LollAlva thanks

## 2014-01-27 NOTE — Telephone Encounter (Signed)
ATC pt na. VM not set up yet WCB Need to ask who DME is or if he needs a new one

## 2014-01-27 NOTE — Telephone Encounter (Signed)
OK to replace with previous settings

## 2014-01-28 NOTE — Telephone Encounter (Signed)
Spoke with pt--[place]  [facility] PO [phone]  [address] [place] [phone]  Order has been placed for replacement CPAP same settings as old machine per Dr Vassie LollAlva.

## 2014-02-02 ENCOUNTER — Ambulatory Visit (INDEPENDENT_AMBULATORY_CARE_PROVIDER_SITE_OTHER): Payer: BC Managed Care – PPO | Admitting: Pulmonary Disease

## 2014-02-02 ENCOUNTER — Encounter: Payer: Self-pay | Admitting: Pulmonary Disease

## 2014-02-02 VITALS — BP 144/100 | HR 101 | Temp 98.6°F | Ht 73.0 in | Wt 331.0 lb

## 2014-02-02 DIAGNOSIS — G4730 Sleep apnea, unspecified: Principal | ICD-10-CM

## 2014-02-02 DIAGNOSIS — G4710 Hypersomnia, unspecified: Secondary | ICD-10-CM

## 2014-02-02 NOTE — Assessment & Plan Note (Signed)
Due to his weight gain he may need higher CPAP settings, hence we'll place him on auto CPAP. We will set you up with an autoCPAP machine - check download in 1 month & make adjustments as needed Recheck your BP -slight high today  Weight loss encouraged, compliance with goal of at least 4-6 hrs every night is the expectation. Advised against medications with sedative side effects Cautioned against driving when sleepy - understanding that sleepiness will vary on a day to day basis

## 2014-02-02 NOTE — Progress Notes (Signed)
Subjective:    Patient ID: Cameron Lynn, male    DOB: 1965-10-01, 48 y.o.   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 He has a strong family h/o premature CAD & low HDL profile. He has gained 20-30 lbs over the last 2 years.  Overnight polysomnogram in November 2011, when he weighed 310 pounds with BMI of 41, showed severe obstructive sleep apnea with RDI of 32/h. This was corrected by CPAP of 11 cm with a large full face mask. He was maintained on this level, and reports good compliance. He was lost to followup until last month when he developed cpap machine malfuntion needing new one . He found a new DME  [place]  [facility]  he obtained a replacement CPAP from them aand now wishes to have buy a CPAP machine from them His current weight is 336 pounds Epworth sleepiness score is 6 Bedtime is around 10 PM and variable, sleep latency minimal, he is retired and has a sedentary lifestyle, sleeps on her side with 2 pillows, is out of bed at 7 AM feeling rested with occasional dryness of mouth. His blood pressure is slight high today There is no history suggestive of cataplexy, sleep paralysis or parasomnias He is starting to play professional poker.   Past Medical History  Diagnosis Date  . No pertinent past medical history   . Chronic back pain   . Obese   . Sleep apnea     severe-uses a cpap    Past Surgical History  Procedure Laterality Date  . Tonsillectomy    . Left hand surgery      Allergies  Allergen Reactions  . Penicillins Shortness Of Breath    REACTION: swelling    History   Social History  . Marital Status: Divorced    Spouse Name: N/A    Number of Children: N/A  . Years of Education: N/A   Occupational History  . retired     Chartered loss adjusterstate trooper   Social History Main Topics  . Smoking status: Current Every Day Smoker -- 1.00 packs/day for 15 years  . Smokeless tobacco: Not on  file  . Alcohol Use: Yes     Comment: occ  . Drug Use: No  . Sexual Activity: Not on file   Other Topics Concern  . Not on file   Social History Narrative  . No narrative on file    Family History  Problem Relation Age of Onset  . Cancer Maternal Grandfather   . Cancer Maternal Grandmother       Review of Systems  Constitutional: Negative for fever and unexpected weight change.  HENT: Negative for congestion, dental problem, ear pain, nosebleeds, postnasal drip, rhinorrhea, sinus pressure, sneezing, sore throat and trouble swallowing.   Eyes: Negative for redness and itching.  Respiratory: Negative for cough, chest tightness, shortness of breath and wheezing.   Cardiovascular: Negative for palpitations and leg swelling.  Gastrointestinal: Negative for nausea and vomiting.  Genitourinary: Negative for dysuria.  Musculoskeletal: Negative for joint swelling.  Skin: Negative for rash.  Neurological: Negative for headaches.  Hematological: Does not bruise/bleed easily.  Psychiatric/Behavioral: Negative for dysphoric mood. The patient is not nervous/anxious.        Objective:   Physical Exam  Gen. Pleasant, obese, in no distress ENT - no lesions, no post nasal drip Neck: No JVD, no thyromegaly, no carotid bruits Lungs: no use of accessory  muscles, no dullness to percussion, decreased without rales or rhonchi  Cardiovascular: Rhythm regular, heart sounds  normal, no murmurs or gallops, no peripheral edema Musculoskeletal: No deformities, no cyanosis or clubbing , no tremors       Assessment & Plan:

## 2014-02-02 NOTE — Patient Instructions (Signed)
We will set you up with an autoCPAP machine - check download in 1 month & make adjustments as needed Recheck your BP -slight high today

## 2014-10-26 ENCOUNTER — Emergency Department (HOSPITAL_BASED_OUTPATIENT_CLINIC_OR_DEPARTMENT_OTHER): Payer: BC Managed Care – PPO

## 2014-10-26 ENCOUNTER — Emergency Department (HOSPITAL_BASED_OUTPATIENT_CLINIC_OR_DEPARTMENT_OTHER)
Admission: EM | Admit: 2014-10-26 | Discharge: 2014-10-26 | Disposition: A | Discharge status: Home / Self Care | Payer: BC Managed Care – PPO | Attending: Emergency Medicine | Admitting: Emergency Medicine

## 2014-10-26 ENCOUNTER — Encounter (HOSPITAL_BASED_OUTPATIENT_CLINIC_OR_DEPARTMENT_OTHER): Payer: Self-pay | Admitting: *Deleted

## 2014-10-26 DIAGNOSIS — E669 Obesity, unspecified: Secondary | ICD-10-CM | POA: Diagnosis not present

## 2014-10-26 DIAGNOSIS — S5011XA Contusion of right forearm, initial encounter: Secondary | ICD-10-CM

## 2014-10-26 DIAGNOSIS — Z9981 Dependence on supplemental oxygen: Secondary | ICD-10-CM | POA: Insufficient documentation

## 2014-10-26 DIAGNOSIS — G8929 Other chronic pain: Secondary | ICD-10-CM | POA: Diagnosis not present

## 2014-10-26 DIAGNOSIS — W1839XA Other fall on same level, initial encounter: Secondary | ICD-10-CM | POA: Diagnosis not present

## 2014-10-26 DIAGNOSIS — Y998 Other external cause status: Secondary | ICD-10-CM | POA: Diagnosis not present

## 2014-10-26 DIAGNOSIS — Z7982 Long term (current) use of aspirin: Secondary | ICD-10-CM | POA: Insufficient documentation

## 2014-10-26 DIAGNOSIS — Z720 Tobacco use: Secondary | ICD-10-CM | POA: Insufficient documentation

## 2014-10-26 DIAGNOSIS — S4991XA Unspecified injury of right shoulder and upper arm, initial encounter: Secondary | ICD-10-CM | POA: Diagnosis present

## 2014-10-26 DIAGNOSIS — S60221A Contusion of right hand, initial encounter: Secondary | ICD-10-CM

## 2014-10-26 DIAGNOSIS — Y9389 Activity, other specified: Secondary | ICD-10-CM | POA: Insufficient documentation

## 2014-10-26 DIAGNOSIS — Z880 Allergy status to penicillin: Secondary | ICD-10-CM | POA: Diagnosis not present

## 2014-10-26 DIAGNOSIS — G4730 Sleep apnea, unspecified: Secondary | ICD-10-CM | POA: Insufficient documentation

## 2014-10-26 DIAGNOSIS — Y92008 Other place in unspecified non-institutional (private) residence as the place of occurrence of the external cause: Secondary | ICD-10-CM | POA: Insufficient documentation

## 2014-10-26 NOTE — ED Notes (Signed)
Larey SeatFell off the [place]. Injury to his right elbow and hand. Hematoma to his elbow. Laceration to his hand. Bleeding controlled.

## 2014-10-26 NOTE — ED Provider Notes (Signed)
CSN: [id number]     Arrival date & time 10/26/14  1754 History   First MD Initiated Contact with Patient 10/26/14 1859     Chief Complaint  Patient presents with  . Arm Injury     (Consider location/radiation/quality/duration/timing/severity/associated sxs/prior Treatment) HPI Comments: Patient with history of aspirin use (  daily) presents with complaint of right arm pain after fall. Patient states that he went to try to grab his dog and lost his balance falling approximately 3 and half feet off a porch onto cement pavers and gravel. Fall occurred at 5:30 PM. He denies other injuries including to his head, neck, lower extremities. No hip pain. Patient developed swelling near his right elbow on his forearm. Patient states that this is currently stable and not getting any bigger. Minimal tenderness in this area. Patient also has pain at the base of his fourth and fifth digits of his right hand. He is able to move everything well without difficulty. Patient sustained several scattered abrasions and superficial lacerations to his hands. Patient stopped at an EMS station prior to arrival and was advised to go to the hospital for evaluation. No treatments prior to arrival.  Patient is a 49 y.o. male presenting with arm injury. The history is provided by the patient.  Arm Injury Associated symptoms: no back pain and no neck pain     Past Medical History  Diagnosis Date  . No pertinent past medical history   . Chronic back pain   . Obese   . Sleep apnea     severe-uses a cpap   Past Surgical History  Procedure Laterality Date  . Tonsillectomy    . Left hand surgery     Family History  Problem Relation Age of Onset  . Cancer Maternal Grandfather   . Cancer Maternal Grandmother    History  Substance Use Topics  . Smoking status: Current Every Day Smoker -- 1.00 packs/day for 15 years  . Smokeless tobacco: Not on file  . Alcohol Use: Yes     Comment: occ    Review of Systems    Constitutional: Negative for activity change.  Musculoskeletal: Positive for myalgias and arthralgias. Negative for back pain, joint swelling, gait problem and neck pain.  Skin: Positive for color change and wound.  Neurological: Negative for weakness and numbness.      Allergies  Penicillins  Home Medications   Prior to Admission medications   Medication Sig Start Date End Date Taking? Authorizing Provider  aspirin 81 MG tablet Take 81 mg by mouth daily.    Historical Provider, MD   BP 144/93 mmHg  Pulse 86  Temp(Src) 98.7 F (37.1 C) (Oral)  Resp 18  Wt 331 lb (150.141 kg)  SpO2 100%   Physical Exam  Constitutional: He appears well-developed and well-nourished.  HENT:  Head: Normocephalic and atraumatic.  Eyes: Conjunctivae are normal.  Neck: Normal range of motion. Neck supple.  Cardiovascular: Normal pulses.   Pulses:      Radial pulses are 2+ on the right side, and 2+ on the left side.  Musculoskeletal: He exhibits edema and tenderness.       Right shoulder: Normal.       Right elbow: Normal.He exhibits normal range of motion, no swelling and no effusion.       Right wrist: Normal. He exhibits normal range of motion, no tenderness and no bony tenderness.       Right hip: Normal.       Cervical  back: Normal.       Right upper arm: Normal.       Right forearm: He exhibits tenderness, swelling and edema. He exhibits no bony tenderness, no deformity and no laceration.       Arms:      Right hand: He exhibits tenderness, bony tenderness and laceration (Scattered, superficial, clean). He exhibits normal range of motion and normal capillary refill. Normal sensation noted. Normal strength noted.       Hands: Neurological: He is alert. No sensory deficit.  Motor, sensation, and vascular distal to the injury is fully intact.   Skin: Skin is warm and dry.  Psychiatric: He has a normal mood and affect.  Nursing note and vitals reviewed.   ED Course  Procedures  (including critical care time) Labs Review Labs Reviewed - No data to display  Imaging Review Dg Elbow Complete Right  10/26/2014   CLINICAL DATA:  Fall, injured right elbow. Posterior pain, swelling.  EXAM: RIGHT ELBOW - COMPLETE 3+ VIEW  COMPARISON:  None.  FINDINGS: Posterior soft tissue swelling over the proximal forearm. No underlying bony abnormality. No fracture, subluxation or dislocation. No elbow joint effusion.  IMPRESSION: Posterior soft tissue swelling without underlying acute bony abnormality.   Electronically Signed   By: Charlett NoseKevin  Dover M.D.   On: 10/26/2014 18:52   Dg Hand Complete Right  10/26/2014   CLINICAL DATA:  Fall, right hand injury. Posterior pain and swelling.  EXAM: RIGHT HAND - COMPLETE 3+ VIEW  COMPARISON:  None.  FINDINGS: There is no evidence of fracture or dislocation. There is no evidence of arthropathy or other focal bone abnormality. Soft tissues are unremarkable.  IMPRESSION: Negative.   Electronically Signed   By: Charlett NoseKevin  Dover M.D.   On: 10/26/2014 20:04     EKG Interpretation None       8:06 PM Patient seen and examined. Pending imaging of hand.   Vital signs reviewed and are as follows: BP 144/93 mmHg  Pulse 86  Temp(Src) 98.7 F (37.1 C) (Oral)  Resp 18  Wt 331 lb (150.141 kg)  SpO2 100%  9:19 PM forearm hematoma is stable on my exam, patient states that it is feeling a little better. Patient informed of negative hand x-rays. He declines pain medication.  We again discussed signs and symptoms of compartment syndrome or poor blood flow which should cause them to come back to the emergency department immediately. This includes color change in hand, worsening pain in the forearm, coolness of the extremity. Patient verbalizes understanding and agrees with plan.  Discussed proper wound care for hand abrasions, mild lacerations.   MDM   Final diagnoses:  Forearm contusion, right, initial encounter  Contusion of right hand, initial encounter    Patient with forearm contusion/hematoma. There is no evidence of worsening hematoma during emergency department stay. There is no evidence of compartment syndrome. No paresthesias or color change. Normal radial pulse. Imaging of hand and elbow are normal. Patient with several abrasions and superficial lacerations that do not require repair with suturing.   Tetanus is up-to-date per patient. He had this performed 2 years ago during hand surgery.   Renne CriglerJoshua Anelisse Jacobson, PA-C 10/26/14 2122  Arby BarretteMarcy Pfeiffer, MD 11/01/14 [phone]

## 2014-10-26 NOTE — Discharge Instructions (Signed)
Please read and follow all provided instructions.  Your diagnoses today include:  1. Forearm contusion, right, initial encounter   2. Contusion of right hand, initial encounter     Tests performed today include:  An x-ray of the affected areas - do NOT show any broken bones  Vital signs. See below for your results today.   Medications prescribed:   None  Take any prescribed medications only as directed.  Home care instructions:   Follow any educational materials contained in this packet  Follow R.I.C.E. Protocol:  R - rest your injury   I  - use ice on injury without applying directly to skin  C - compress injury with bandage or splint  E - elevate the injury as much as possible  Follow-up instructions: Please follow-up with your primary care provider or the provided orthopedic physician (bone specialist) if you continue to have significant pain in 1 week. In this case you may have a more severe injury that requires further care.   Return instructions:   Please return if your fingers are numb or tingling, appear gray or blue, or you have severe pain (also elevate the arm and loosen splint or wrap if you were given one)  Please return to the Emergency Department if you experience worsening symptoms.   Please return if you have any other emergent concerns.  Additional Information:  Your vital signs today were: BP 144/93 mmHg   Pulse 86   Temp(Src) 98.7 F (37.1 C) (Oral)   Resp 18   Wt 331 lb (150.141 kg)   SpO2 100% If your blood pressure (BP) was elevated above 135/85 this visit, please have this repeated by your doctor within one month. --------------

## 2015-09-18 IMAGING — CR DG HAND COMPLETE 3+V*R*
3 series · 3 of 3 positions shown · non-contrast
Comparison: None.

CLINICAL DATA: Fall, right hand injury. Posterior pain and
swelling.

EXAM:
RIGHT HAND - COMPLETE 3+ VIEW

[x hand pa right]
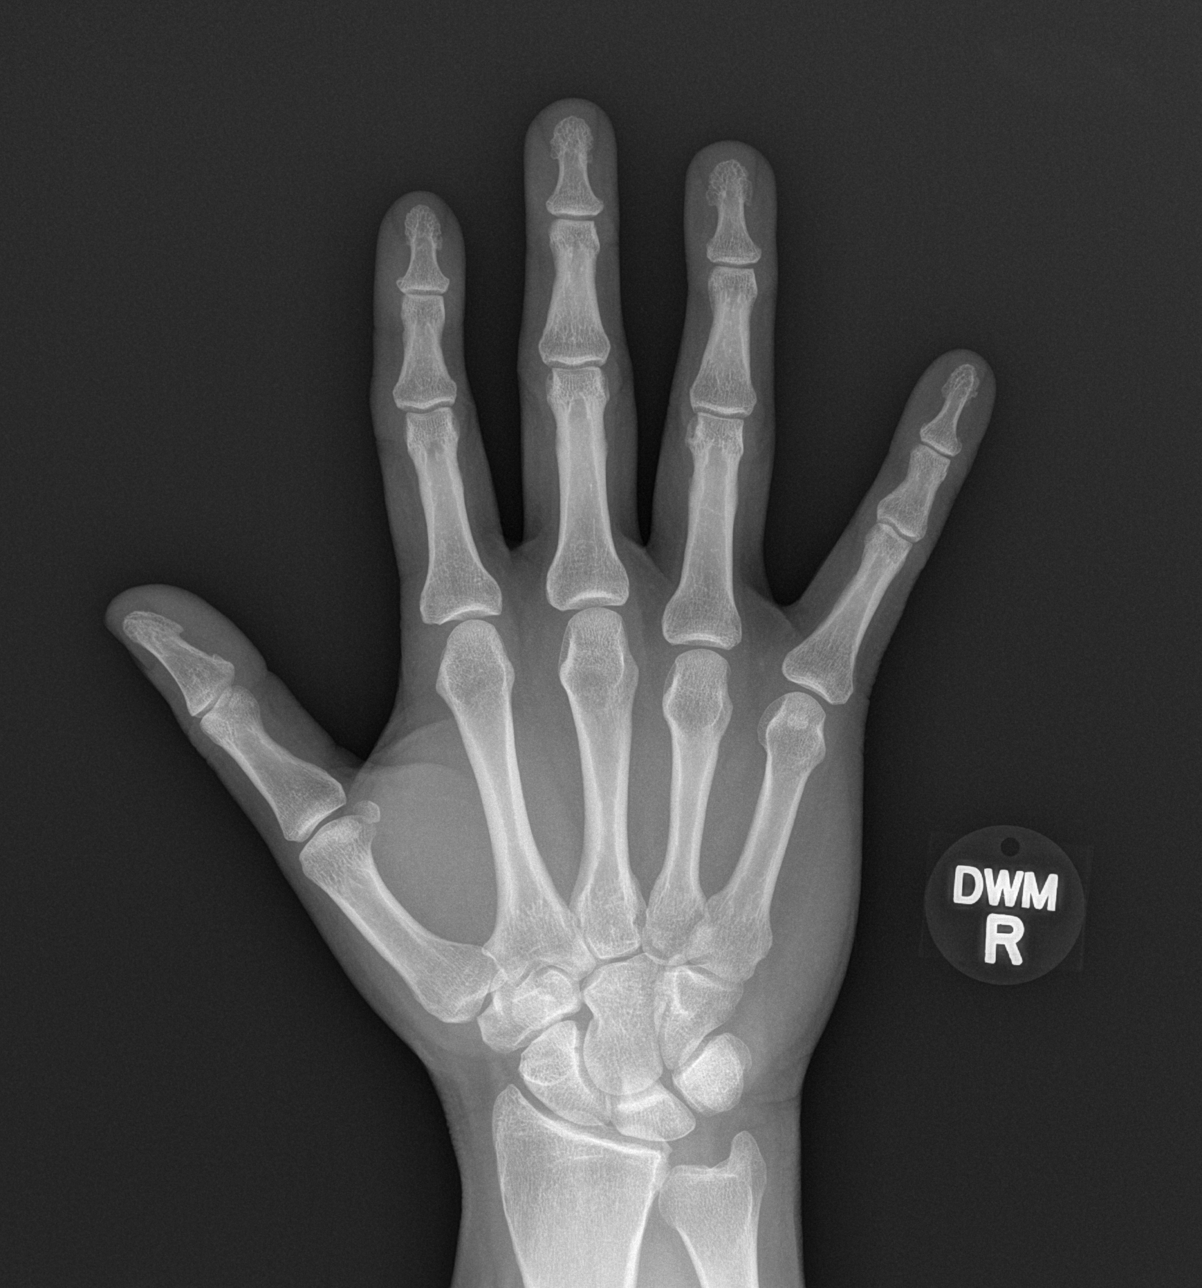

[x hand oblique right]
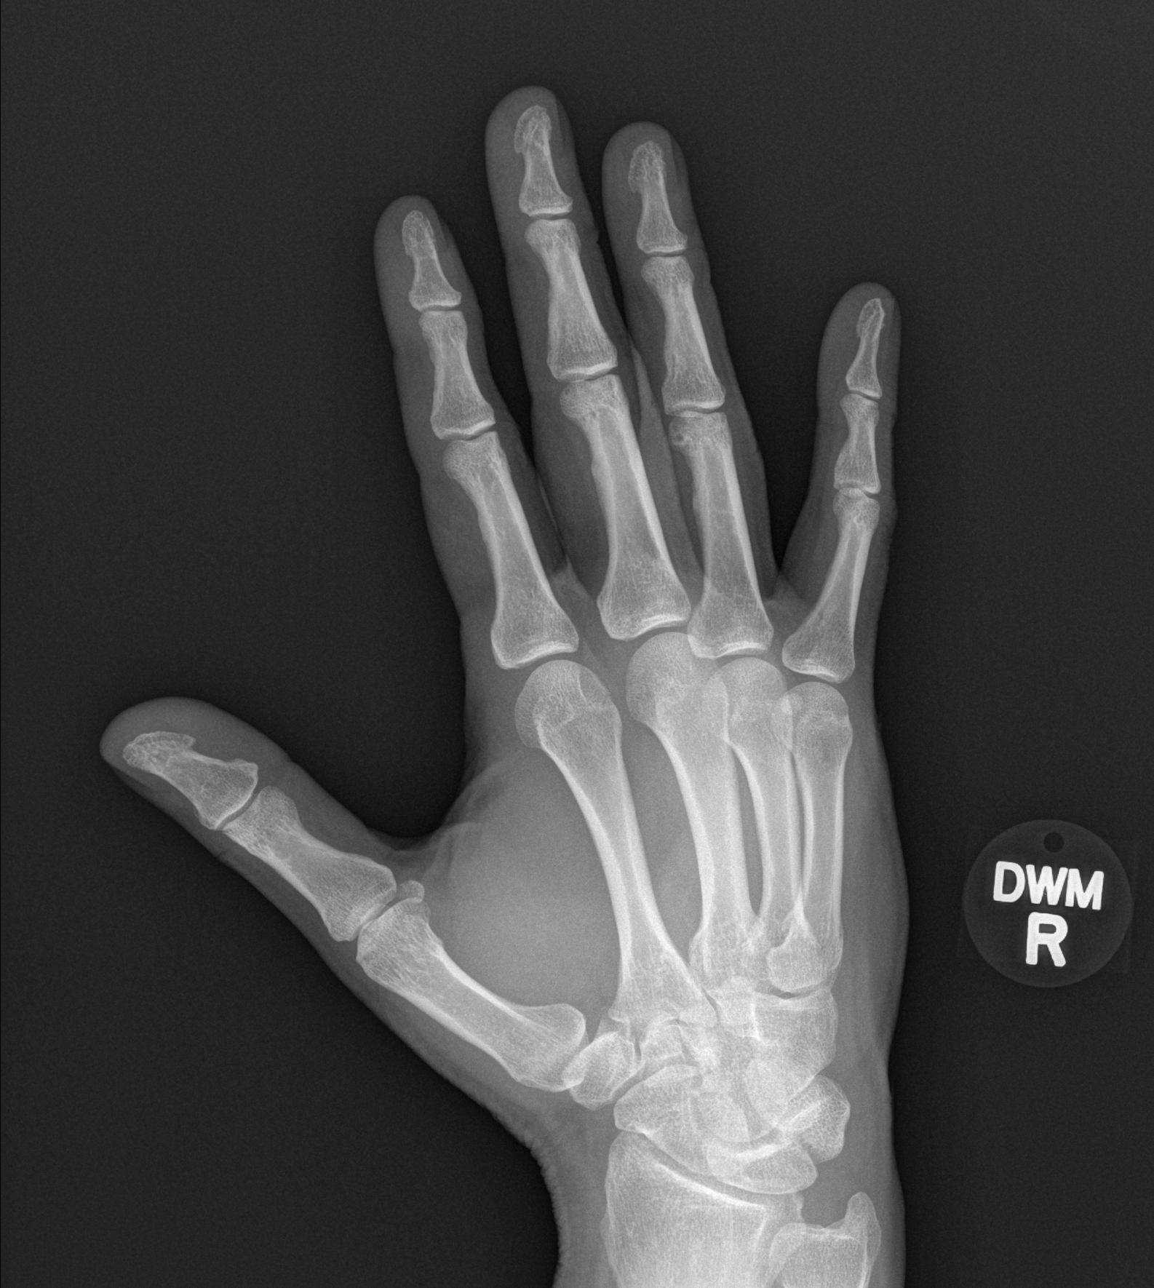

[x hand lat right]
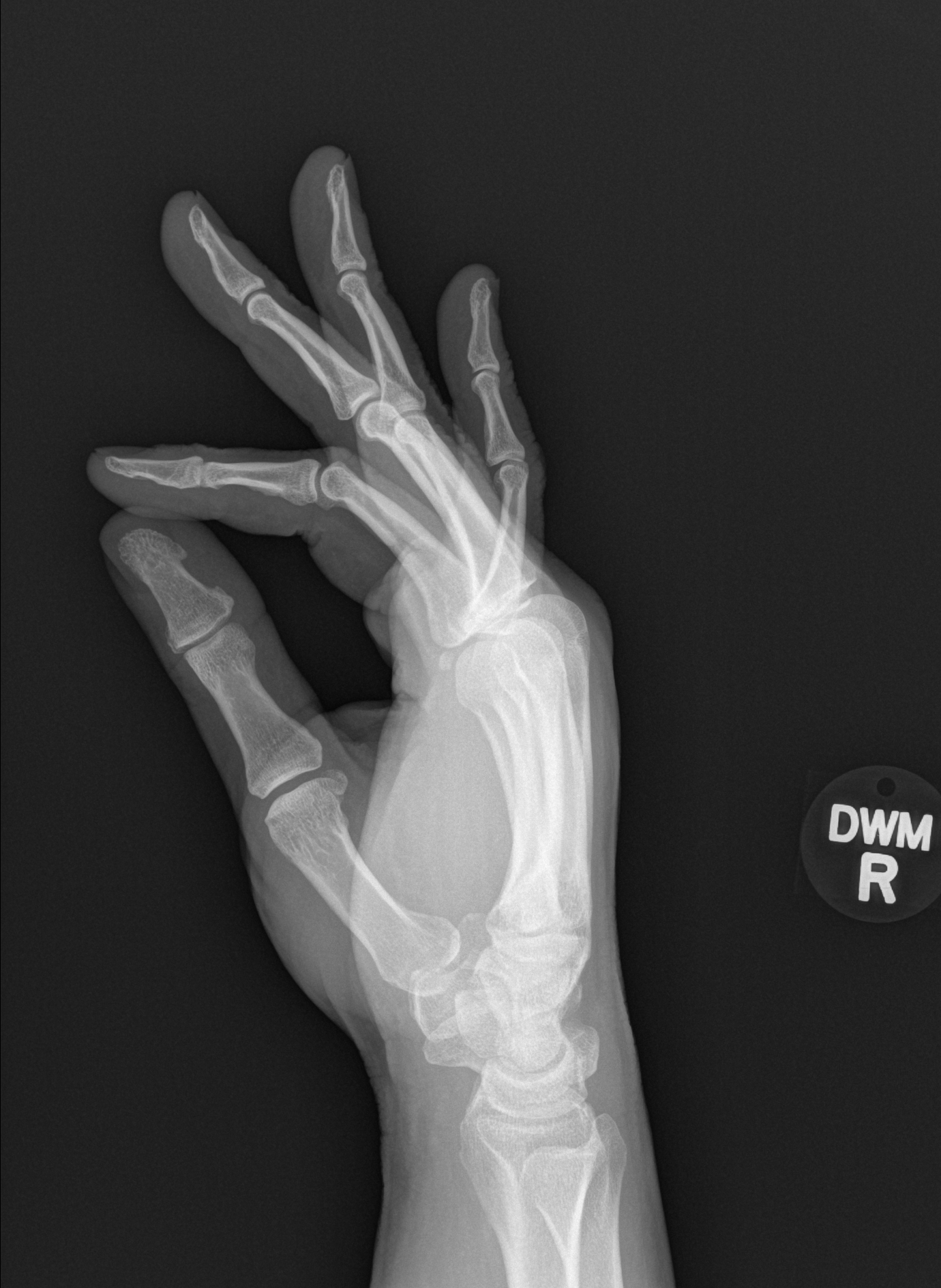

[3 of 3 positions shown; findings below may reference images not displayed]

FINDINGS: There is no evidence of fracture or dislocation. There is no
evidence of arthropathy or other focal bone abnormality. Soft
tissues are unremarkable.
IMPRESSION: Negative.

## 2018-10-19 ENCOUNTER — Ambulatory Visit: Payer: BC Managed Care – PPO | Admitting: Podiatry

## 2018-10-19 ENCOUNTER — Other Ambulatory Visit: Payer: Self-pay

## 2018-10-19 DIAGNOSIS — L308 Other specified dermatitis: Secondary | ICD-10-CM | POA: Diagnosis not present

## 2018-10-19 DIAGNOSIS — Q828 Other specified congenital malformations of skin: Secondary | ICD-10-CM | POA: Diagnosis not present

## 2018-10-19 DIAGNOSIS — L409 Psoriasis, unspecified: Secondary | ICD-10-CM

## 2018-10-19 DIAGNOSIS — L989 Disorder of the skin and subcutaneous tissue, unspecified: Secondary | ICD-10-CM

## 2018-10-19 DIAGNOSIS — L988 Other specified disorders of the skin and subcutaneous tissue: Secondary | ICD-10-CM

## 2018-10-19 DIAGNOSIS — R234 Changes in skin texture: Secondary | ICD-10-CM

## 2018-10-19 NOTE — Progress Notes (Signed)
  Subjective:  Patient ID: Cameron Lynn, male    DOB: Sep 29, 1965,  MRN: [id number]  Chief Complaint  Patient presents with  . Skin Problem    BL plantar foot irritation, burning, itching x 4-5 years; 10/10 sharp burning needle sticks -worse with walking and shoes Tx: none     53 y.o. male presents with the above complaint.  Has had severe skin lesions all over his body has tried multiple therapies including light therapy multiple neurologic modulators including Humira Enbrel currently taking Soriatane which she states has helped also using light therapy which help.  Has had skin biopsies before which at different times has shown psoriasis, lupus and B-cell mediated skin conditions.  States that the pain is so severe that he has difficulty walking.  He has active bleeding from the dryness and cracking of his feet.  Review of Systems: Negative except as noted in the HPI. Denies N/V/F/Ch.  Past Medical History:  Diagnosis Date  . Chronic back pain   . No pertinent past medical history   . Obese   . Sleep apnea    severe-uses a cpap    Current Outpatient Medications:  .  acitretin (SORIATANE) 25 MG capsule, , Disp: , Rfl:  .  aspirin 81 MG tablet, Take 81 mg by mouth daily., Disp: , Rfl:  .  halobetasol (ULTRAVATE) 0.05 % ointment, , Disp: , Rfl:  .  lisinopril (PRINIVIL,ZESTRIL) 40 MG tablet, , Disp: , Rfl:  .  methotrexate (RHEUMATREX) 2.5 MG tablet, , Disp: , Rfl:   Social History   Tobacco Use  Smoking Status Current Every Day Smoker  . Packs/day: 1.00  . Years: 15.00  . Pack years: 15.00    Allergies  Allergen Reactions  . Penicillins Shortness Of Breath    REACTION: swelling   Objective:  There were no vitals filed for this visit. There is no height or weight on file to calculate BMI. Constitutional Well developed. Well nourished.  Vascular Dorsalis pedis pulses palpable bilaterally. Posterior tibial pulses palpable bilaterally. Capillary refill normal to all  digits.  No cyanosis or clubbing noted. Pedal hair growth normal.  Neurologic Normal speech. Oriented to person, place, and time. Epicritic sensation to light touch grossly present bilaterally.  Dermatologic Nails normal   Multiple psoriasiform plaques about the bilateral ankles, bilateral hands. Severe hyperkeratosis to the plantar aspect of both feet with fissuring cracking of the skin  Orthopedic: Normal joint ROM without pain or crepitus bilaterally. No visible deformities. No bony tenderness.   Radiographs: None Assessment:   1. Psoriasis   2. Psoriasiform eczema   3. Hyperkeratosis palmaris et plantaris   4. Fissured skin   5. Koebner phenomenon    Plan:  Patient was evaluated and treated and all questions answered.  Psoriasis, hyperkeratosis pulmonary stable tenderness -Severe skin lesions present to the plantar aspect of the feet.  Has failed multiple immunologic medications.  States patient that at this point the lesions in the foot are more hyperkeratotic then psoriatic appearing and thus rather than using anti-inflammatory cream trial using urea cream.  Avoid debridement today out of concern for Koebner phenomenon.  Return in about 4 weeks (around 11/16/2018) for Skin lesions f/u after using urea.

## 2018-11-14 ENCOUNTER — Ambulatory Visit: Payer: BC Managed Care – PPO | Admitting: Podiatry

## 2018-11-14 DIAGNOSIS — L409 Psoriasis, unspecified: Secondary | ICD-10-CM

## 2018-11-14 DIAGNOSIS — L308 Other specified dermatitis: Secondary | ICD-10-CM | POA: Diagnosis not present

## 2018-11-23 NOTE — Progress Notes (Signed)
  Subjective:  Patient ID: Cameron Lynn, male    DOB: August 30, 1966,  MRN: [id number]  Chief Complaint  Patient presents with  . Foot Pain    f/u on painful skin lesions, pt is doing better, pt has been soaking foot in epsom salts, as well as the ointment prescriibed , pt states that feet hurt based on the weather    53 y.o. male presents with the above complaint.  History above confirmed with patient  Review of Systems: Negative except as noted in the HPI. Denies N/V/F/Ch.  Past Medical History:  Diagnosis Date  . Chronic back pain   . No pertinent past medical history   . Obese   . Sleep apnea    severe-uses a cpap    Current Outpatient Medications:  .  acitretin (SORIATANE) 25 MG capsule, , Disp: , Rfl:  .  aspirin 81 MG tablet, Take 81 mg by mouth daily., Disp: , Rfl:  .  halobetasol (ULTRAVATE) 0.05 % ointment, , Disp: , Rfl:  .  lisinopril (PRINIVIL,ZESTRIL) 40 MG tablet, , Disp: , Rfl:  .  methotrexate (RHEUMATREX) 2.5 MG tablet, , Disp: , Rfl:  .  triamcinolone ointment (KENALOG) 0.1 %, Apply topically., Disp: , Rfl:   Social History   Tobacco Use  Smoking Status Current Every Day Smoker  . Packs/day: 1.00  . Years: 15.00  . Pack years: 15.00    Allergies  Allergen Reactions  . Penicillins Shortness Of Breath    REACTION: swelling   Objective:  There were no vitals filed for this visit. There is no height or weight on file to calculate BMI. Constitutional Well developed. Well nourished.  Vascular Dorsalis pedis pulses palpable bilaterally. Posterior tibial pulses palpable bilaterally. Capillary refill normal to all digits.  No cyanosis or clubbing noted. Pedal hair growth normal.  Neurologic Normal speech. Oriented to person, place, and time. Epicritic sensation to light touch grossly present bilaterally.  Dermatologic Nails normal Multiple psoriasiform plaques about the bilateral ankles, bilateral hands. Improved hyperkeratosis to the plantar aspect of  both feet with fissuring cracking of the skin  Orthopedic: Normal joint ROM without pain or crepitus bilaterally. No visible deformities. No bony tenderness.   Radiographs: None Assessment:   1. Psoriasis   2. Psoriasiform eczema    Plan:  Patient was evaluated and treated and all questions answered.  Psoriasis, hyperkeratosis pulmonary stable tenderness -States that the lesions are doing much better has been using the moisturizing cream and urea cream and thinks that is done much better.  States that the pain was improving to the point where he could actually walk.  Return in about 6 weeks (around 12/26/2018) for Skin lesions f/u .

## 2018-12-26 ENCOUNTER — Ambulatory Visit: Payer: BC Managed Care – PPO | Admitting: Podiatry

## 2020-09-14 ENCOUNTER — Ambulatory Visit: Payer: Self-pay | Attending: Internal Medicine

## 2020-09-14 DIAGNOSIS — Z23 Encounter for immunization: Secondary | ICD-10-CM

## 2020-09-14 NOTE — Progress Notes (Signed)
   Covid-19 Vaccination Clinic  Name:  Cameron Lynn    MRN: [id number] DOB: 12/13/1965  09/14/2020  Cameron Lynn was observed post Covid-19 immunization for 30 minutes based on pre-vaccination screening without incident. He was provided with Vaccine Information Sheet and instruction to access the V-Safe system.   Cameron Lynn was instructed to call 911 with any severe reactions post vaccine: Marland Kitchen Difficulty breathing  . Swelling of face and throat  . A fast heartbeat  . A bad rash all over body  . Dizziness and weakness   Immunizations Administered    Name Date Dose VIS Date Route   Pfizer COVID-19 Vaccine 09/14/2020  2:49 PM 0.3 mL 07/14/2020 Intramuscular   Manufacturer: [employer], [employer]   Lot: [id number]   NDC: [id number]

## 2021-08-24 NOTE — Progress Notes (Signed)
New Patient Note  RE: Cameron Lynn MRN: [id number] DOB: May 17, 1966 Date of Office Visit: 08/25/2021  Consult requested by: No ref. provider found Primary care provider: Patient, No Pcp Per (Inactive)  Chief Complaint: Rash and Eczema  History of Present Illness: I had the pleasure of seeing Harvell Barich for initial evaluation at the [facility] on 08/25/2021. He is a 55 y.o. male, who is self-referred here for the evaluation of rash.  Rash started about 7 years ago. It initially started as dry skin and then it spread all over the body. The light treatment cleared up the rash on his torso but still persistent on his arms, hand and feet. Describes them as very dry, itchy, scaly and painful. It usually flares with the weather changes.  Today is a bad day. He retired 7 years ago and also quit smoking and drinking then. Denies having any hobbies that involve contact with chemicals.   Associated symptoms include: none.  Suspected triggers are unknown. Denies any fevers, chills, changes in medications, foods, personal care products or recent infections.   Currently using fragrance free moisturizer and body wash. Using tide products - unsure if it is scented or unscented.   He has tried the following therapies: clobetasol, triamcinolone, calcipotriene, methotrexate, phototherapy, Humira with marginal benefit. Takes doxepin 25mg  daily at night.  Currently on Skyrizi injection every 3 months x 1 year - managed by Dr. Memory Argue (dermatology) in [facility]. Last visit was in November 2022 and has upcoming visit later this month.  He apparently had a course of steroid and antibiotics when he had Covid-19 which is the ONLY time his rash completely cleared up. He is not sure about subsequent oral prednisone but nothing else made the rash clear up.  Previous work up includes: patient went to see multiple dermatologists for this including locally and in [place] with  no relief. He also seen rheumatology and heme/onc for evaluation.  Previous history of rash/hives: patient always had dry skin. Patient is up to date with the following cancer screening tests: physical exam, colonoscopy. No recent bloodwork. No prior patch testing.   Patient is concerned about allergic triggers as one of his relatives was allergic to paper ink and the other some other chemical and once they stopped using it the rash went away.   04/02/2020 dermatology visit: "HENG GOLDNER is a 55 y.o. male who presents to dermatology clinic today as a return patient with the following skin concern(s): psoriasiform rash.  The area(s) of concern have the following characteristics: scaly, itchy, painful, cracked. It has been present for approximately 6 years (however outside note reports had some involvement since 1988). Affected areas include the palms and soles of the feet, which are the worst areas, dorsal forearms, elbows, knees, lower back and buttocks, ankles, and thighs.  The patient has had an extensive workup for this. Per review of outside medical records: patient has had two biopsies in 06/12/18 and 06/26/18. Initial biopsy showed a psoriasiform and lichenoid dermatitis concerning for MF vs syphilis vs hypertrophic lupus. Subsequent RPR, ANA, LDH, CBC w/dif, and CMP were unremarkable. T cell receptor clonality studies at that time showed a single biallelic clone or potentially two independent clones w/ potential concern for a lymphoid neoplasm. Repeat biopsy on 06/26/18 showed a psoriasiform dermatitis with some atypical features, raising additional concern for CTCL. However, T-cell receptor studies were negative. Additionally, a flow cytometry was performed which was negative. Patient denies joint  pain, other than he has so much stiffness in his hands and feet from his rash and is unable to make a fist or walk. He states he was evaluated by a rheumatologist for this and was told he does not have  PsA.   Prior treatment include: Humira, Enbrel, Taltz, MTX + Otezla + nbUVB (which helped the most except for the hands and feet), cyclosporine 10/10/2017, d/c'd a few weeks later due to GI distress that recurred with rechallenge, Stelara 3/19-8/9, MTX in 10/18, and acitretin 25 mg TIW to daily in 2019. Topical urea.  Interval Hx: He is currently doing nbUVB, which helps with lesions on the lower back and buttocks. He does this in a standing unit in [place], but it does not treat his hands and feet. Also using topical halobetasol ointment and triamcinolone ointment. He started MTX 15mg  weekly and soriatane 10mg  daily at his initial visit on 02/27/20. He notes some fatigue on the day he takes his MTX, but is otherwise tolerating well. He states that the rash hasn't improved since he started these medications. The rash is itchy. Hydroxyzine 25mg  qhs prn made him too sleepy and didn't affect the itch.  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 The most helpful treatment so far has been UVB therapy, but he hasn't been able to treat the hands/feet. Would like to get him a home hands/feet unit or start treatments with our clinic's hand/foot unit.   Plan: - The above diagnosis and treatment options were discussed with the patient. - Continue phototherapy. Start treating the hands/feet with phototherapy as well.  - Continue MTX 15mg  weekly with folic acid - Continue Soriatane 10mg  daily - Continue TAC 0.1% to the affected areas - CBC, CMP, triglycerides today - Discontinue hydroxyzine. Start doxepin 10mg  qhs prn - Discussed case with patient and nurse Ivin Booty. She has already faxed the request for his home light unit to the manufacturer and they should be reaching out to the patient in the next week or so - If no improvement with light, consider repeat bx at next visit - Also discussed  possible patch testing as patient is concerned about whether this could be an allergic process. Less likely, but we can certainly facilitate patch testing if he would like to pursue this in the future. - RTC 3 months"  06/12/2018 skin biopsy: "The findings are unusual and of a mixed reaction pattern.  While there is prominent psoriasiform epidermal hyperplasia, there is also well-developed interface change with numerous necrotic keratinocytes.  A mixed pattern can sometimes be seen in entities like drug eruption, syphilis, or mycosis fungoides.  The features are not currently diagnostic of mycosis fungoides, however.  Hypertrophic lupus erythematosus is also possible.  The sample will be sent for T-cell rearrangement studies.  Clinical correlation, with potential rebiopsy for progression, is recommended."  Assessment and Plan: Frutoso is a 55 y.o. male with: Psoriasis Developed rash 7 years ago which initially was all over his body. This improved after phototherapy but now still has the skin issues mainly on the soles of his feet and hands making it difficult to walk and do things. He has seen multiple dermatologists, rheumatology and heme/onc and had various skin biopsies. He tried clobetasol, triamcinolone, calcipotriene, methotrexate, phototherapy, Humira, Enbrel, taltz, otezla, cyclosporine, Stelara, acitretin with marginal benefit. Currently on skyrizi, doxepin and topical steroid creams. Concerned about potential allergic triggers and is very frustrated  that nothing seems to help his condition. Discussed with patient at length that based on clinical history, physical exam and prior records this rash is unlikely to have any allergic trigger. However, due to severity and length of symptoms will order bloodwork from allergy standpoint. Once the bloodwork resulted, will discuss with his dermatologist whether a trial of Dupixent and/or patch testing may be beneficial even though low likelihood to have  contact dermatitis or atopic dermatitis. Continue medications as per dermatology.  Advised patient he may want to be seen by a psoriasis center further evaluation.   Return if symptoms worsen or fail to improve.  No orders of the defined types were placed in this encounter.  Lab Orders         Allergens w/Total IgE Area 2         Alpha-Gal Panel         ANA w/Reflex         C3 and C4         CBC with Differential/Platelet         Comprehensive metabolic panel         C-reactive protein         Sedimentation rate         Thyroid Cascade Profile         Tryptase         Food Allergy Profile      Other allergy screening: Asthma: no Rhino conjunctivitis:  Mild symptoms with tree pollen. Food allergy: no Medication allergy: yes Hymenoptera allergy: no History of recurrent infections suggestive of immunodeficency: no  Diagnostics: None.   Past Medical History: Patient Active Problem List   Diagnosis Date Noted   Psoriasis 08/25/2021   OBESITY, UNSPECIFIED 09/02/2010   TOBACCO ABUSE 09/02/2010   GERD 08/05/2010   ECZEMA 08/04/2010   HYPERSOMNIA, ASSOCIATED WITH SLEEP APNEA 07/27/2010   HYPERTRIGLYCERIDEMIA 07/26/2010   Past Medical History:  Diagnosis Date   Chronic back pain    No pertinent past medical history    Obese    Sleep apnea    severe-uses a cpap   Past Surgical History: Past Surgical History:  Procedure Laterality Date   ADENOIDECTOMY     left hand surgery     TONSILLECTOMY     Medication List:  Current Outpatient Medications  Medication Sig Dispense Refill   atorvastatin (LIPITOR) 20 MG tablet Take 20 mg by mouth daily.     doxepin (SINEQUAN) 10 MG capsule Take 10 mg by mouth at bedtime.     lisinopril (PRINIVIL,ZESTRIL) 40 MG tablet      SKYRIZI 150 MG/ML SOSY Inject into the skin.     No current facility-administered medications for this visit.   Allergies: Allergies  Allergen Reactions   Penicillins Shortness Of Breath    REACTION:  swelling   Social History: Social History   Socioeconomic History   Marital status: Divorced    Spouse name: Not on file   Number of children: Not on file   Years of education: Not on file   Highest education level: Not on file  Occupational History   Occupation: retired    Fish farm manager: [employer]    Comment: state trooper  Tobacco Use   Smoking status: Former    Packs/day: 1.00    Years: 15.00    Pack years: 15.00    Types: Cigarettes    Passive exposure: Never   Smokeless tobacco: Current    Types: Secondary school teacher  Vaping Use: Never used  Substance and Sexual Activity   Alcohol use: Yes    Comment: occ   Drug use: No   Sexual activity: Not on file  Other Topics Concern   Not on file  Social History Narrative   ** Merged History Encounter **       Social Determinants of Health   Financial Resource Strain: Not on file  Food Insecurity: Not on file  Transportation Needs: Not on file  Physical Activity: Not on file  Stress: Not on file  Social Connections: Not on file   Lives in a house which is 55 years old. Smoking: quit 7 years ago Occupation: retired  Landscape architect History: Immunologist in the house: no Engineer, civil (consulting) in the family room: no Carpet in the bedroom: yes Heating: electric Cooling: central Pet: yes 1 dog x 10 yrs  Family History: Family History  Problem Relation Age of Onset   Allergic rhinitis Mother    Cancer Maternal Grandmother    Cancer Maternal Grandfather    Allergic Disorder Maternal Grandfather    Review of Systems  Constitutional:  Negative for appetite change, chills, fever and unexpected weight change.  HENT:  Negative for congestion and rhinorrhea.   Eyes:  Negative for itching.  Respiratory:  Negative for cough, chest tightness, shortness of breath and wheezing.   Cardiovascular:  Negative for chest pain.  Gastrointestinal:  Negative for abdominal pain.  Genitourinary:  Negative for difficulty urinating.   Skin:  Positive for rash.  Neurological:  Negative for headaches.   Objective: BP 100/80   Pulse 84   Temp 98.4 F (36.9 C) (Temporal)   Resp 18   Ht 6\' 1"  (1.854 m)   Wt (!) 336 lb (152.4 kg)   SpO2 97%   BMI 44.33 kg/m  Body mass index is 44.33 kg/m. Physical Exam Vitals and nursing note reviewed.  Constitutional:      Appearance: Normal appearance. He is well-developed. He is obese.  HENT:     Head: Normocephalic and atraumatic.     Right Ear: Tympanic membrane and external ear normal.     Left Ear: Tympanic membrane and external ear normal.     Nose: Nose normal.     Mouth/Throat:     Mouth: Mucous membranes are moist.     Pharynx: Oropharynx is clear.  Eyes:     Conjunctiva/sclera: Conjunctivae normal.  Cardiovascular:     Rate and Rhythm: Normal rate and regular rhythm.     Heart sounds: Normal heart sounds. No murmur heard.   No friction rub. No gallop.  Pulmonary:     Effort: Pulmonary effort is normal.     Breath sounds: Normal breath sounds. No wheezing, rhonchi or rales.  Musculoskeletal:     Cervical back: Neck supple.  Skin:    General: Skin is warm and dry.     Findings: Rash present.     Comments: Extremely dry, severely hyperkeratotic, fissured skin on the soles of his feet and palms of his hands bilaterally. Silvery scale patches on lower extremities and and upper extremities as well. No infection/oozing noted.  Neurological:     Mental Status: He is alert and oriented to person, place, and time.  Psychiatric:        Behavior: Behavior normal.  The plan was reviewed with the patient/family, and all questions/concerned were addressed.  It was my pleasure to see Javonne today and participate in his care. Please feel free to contact me with any  questions or concerns.  Sincerely,  Rexene Alberts, DO Allergy & Immunology  [facility] office: [place] office: [phone]

## 2021-08-25 ENCOUNTER — Other Ambulatory Visit: Payer: Self-pay

## 2021-08-25 ENCOUNTER — Encounter: Payer: Self-pay | Admitting: Allergy

## 2021-08-25 ENCOUNTER — Ambulatory Visit: Payer: BC Managed Care – PPO | Admitting: Allergy

## 2021-08-25 DIAGNOSIS — L859 Epidermal thickening, unspecified: Secondary | ICD-10-CM | POA: Diagnosis not present

## 2021-08-25 DIAGNOSIS — L409 Psoriasis, unspecified: Secondary | ICD-10-CM | POA: Insufficient documentation

## 2021-08-25 DIAGNOSIS — L299 Pruritus, unspecified: Secondary | ICD-10-CM | POA: Diagnosis not present

## 2021-08-25 DIAGNOSIS — R21 Rash and other nonspecific skin eruption: Secondary | ICD-10-CM

## 2021-08-25 NOTE — Patient Instructions (Addendum)
Rash: Not sure what's causing rash. I'm going to order some bloodwork to rule out any potential allergic triggers. Make sure you only use things that are good for sensitive skin. Including the laundry and cleaning items.  Once I have the bloodwork back, I will call your dermatologist to discuss Dupixent injections and/or doing patch testing. However, it's low likelihood that you have contact dermatitis or eczema as your skin biopsy did not show this and your rash distribution is not typical of these 2 skin disorders.  Get bloodwork We are ordering labs, so please allow 1-2 weeks for the results to come back. With the newly implemented Cures Act, the labs might be visible to you at the same time that they become visible to me. However, I will not address the results until all of the results are back, so please be patient.  In the meantime, continue recommendations in your patient instructions, including avoidance measures (if applicable), until you hear from me.  May want to get a second opinion from: [email or web address].powers-gomez.info/ [email or web address]  Follow up as needed.  Skin care recommendations  Bath time: Always use lukewarm water. AVOID very hot or cold water. Keep bathing time to 5-10 minutes. Do NOT use bubble bath. Use a mild soap and use just enough to wash the dirty areas. Do NOT scrub skin vigorously.  After bathing, pat dry your skin with a towel. Do NOT rub or scrub the skin.  Moisturizers and prescriptions:  ALWAYS apply moisturizers immediately after bathing (within 3 minutes). This helps to lock-in moisture. Use the moisturizer several times a day over the whole body. Good summer moisturizers include: Aveeno, CeraVe, Cetaphil. Good winter moisturizers include: Aquaphor, Vaseline, Cerave, Cetaphil, Eucerin, Vanicream. When using moisturizers along with medications, the moisturizer  should be applied about one hour after applying the medication to prevent diluting effect of the medication or moisturize around where you applied the medications. When not using medications, the moisturizer can be continued twice daily as maintenance.  Laundry and clothing: Avoid laundry products with added color or perfumes. Use unscented hypo-allergenic laundry products such as Tide free, Cheer free & gentle, and All free and clear.  If the skin still seems dry or sensitive, you can try double-rinsing the clothes. Avoid tight or scratchy clothing such as wool. Do not use fabric softeners or dyer sheets.

## 2021-08-25 NOTE — Assessment & Plan Note (Addendum)
Developed rash 7 years ago which initially was all over his body. This improved after phototherapy but now still has the skin issues mainly on the soles of his feet and hands making it difficult to walk and do things. He has seen multiple dermatologists, rheumatology and heme/onc and had various skin biopsies. He tried clobetasol, triamcinolone, calcipotriene, methotrexate, phototherapy, Humira, Enbrel, taltz, otezla, cyclosporine, Stelara, acitretin with marginal benefit. Currently on skyrizi, doxepin and topical steroid creams. Concerned about potential allergic triggers and is very frustrated that nothing seems to help his condition. . Discussed with patient at length that based on clinical history, physical exam and prior records this rash is unlikely to have any allergic trigger. Marland Kitchen However, due to severity and length of symptoms will order bloodwork from allergy standpoint. . Once the bloodwork resulted, will discuss with his dermatologist whether a trial of Dupixent and/or patch testing may be beneficial even though low likelihood to have contact dermatitis or atopic dermatitis. . Continue medications as per dermatology.  . Advised patient he may want to be seen by a psoriasis center further evaluation.
